# Patient Record
Sex: Female | Born: 1965 | Race: White | Hispanic: No | Marital: Married
Health system: Southern US, Community
[De-identification: ages and names within clinical notes are randomized; demographics above are authoritative.]

---

## 1998-04-10 ENCOUNTER — Other Ambulatory Visit: Admission: RE | Admit: 1998-04-10 | Discharge: 1998-04-10 | Payer: Self-pay | Admitting: Obstetrics and Gynecology

## 2000-11-10 ENCOUNTER — Other Ambulatory Visit: Admission: RE | Admit: 2000-11-10 | Discharge: 2000-11-10 | Payer: Self-pay | Admitting: Obstetrics and Gynecology

## 2006-12-24 ENCOUNTER — Encounter (INDEPENDENT_AMBULATORY_CARE_PROVIDER_SITE_OTHER): Payer: Self-pay | Admitting: Obstetrics and Gynecology

## 2006-12-25 ENCOUNTER — Inpatient Hospital Stay (HOSPITAL_COMMUNITY)
Admission: RE | Admit: 2006-12-25 | Discharge: 2006-12-26 | Payer: Self-pay | Admitting: Thoracic Surgery (Cardiothoracic Vascular Surgery)

## 2007-01-29 ENCOUNTER — Ambulatory Visit (HOSPITAL_COMMUNITY): Admission: RE | Admit: 2007-01-29 | Discharge: 2007-01-29 | Payer: Self-pay | Admitting: Obstetrics and Gynecology

## 2007-04-08 ENCOUNTER — Encounter: Admission: RE | Admit: 2007-04-08 | Discharge: 2007-04-08 | Payer: Self-pay | Admitting: General Surgery

## 2009-03-04 IMAGING — MG MM DIGITAL SCREENING BILAT W/ CAD
4 series · 4 of 4 positions shown · non-contrast
Comparison: none

DG SCREEN MAMMOGRAM BILATERAL
Bilateral CC and MLO view(s) were taken.

DIGITAL SCREENING MAMMOGRAM WITH CAD:
The breast tissue is almost entirely fatty.  There is no dominant mass, architectural distortion or
calcification to suggest malignancy.

[R CC]
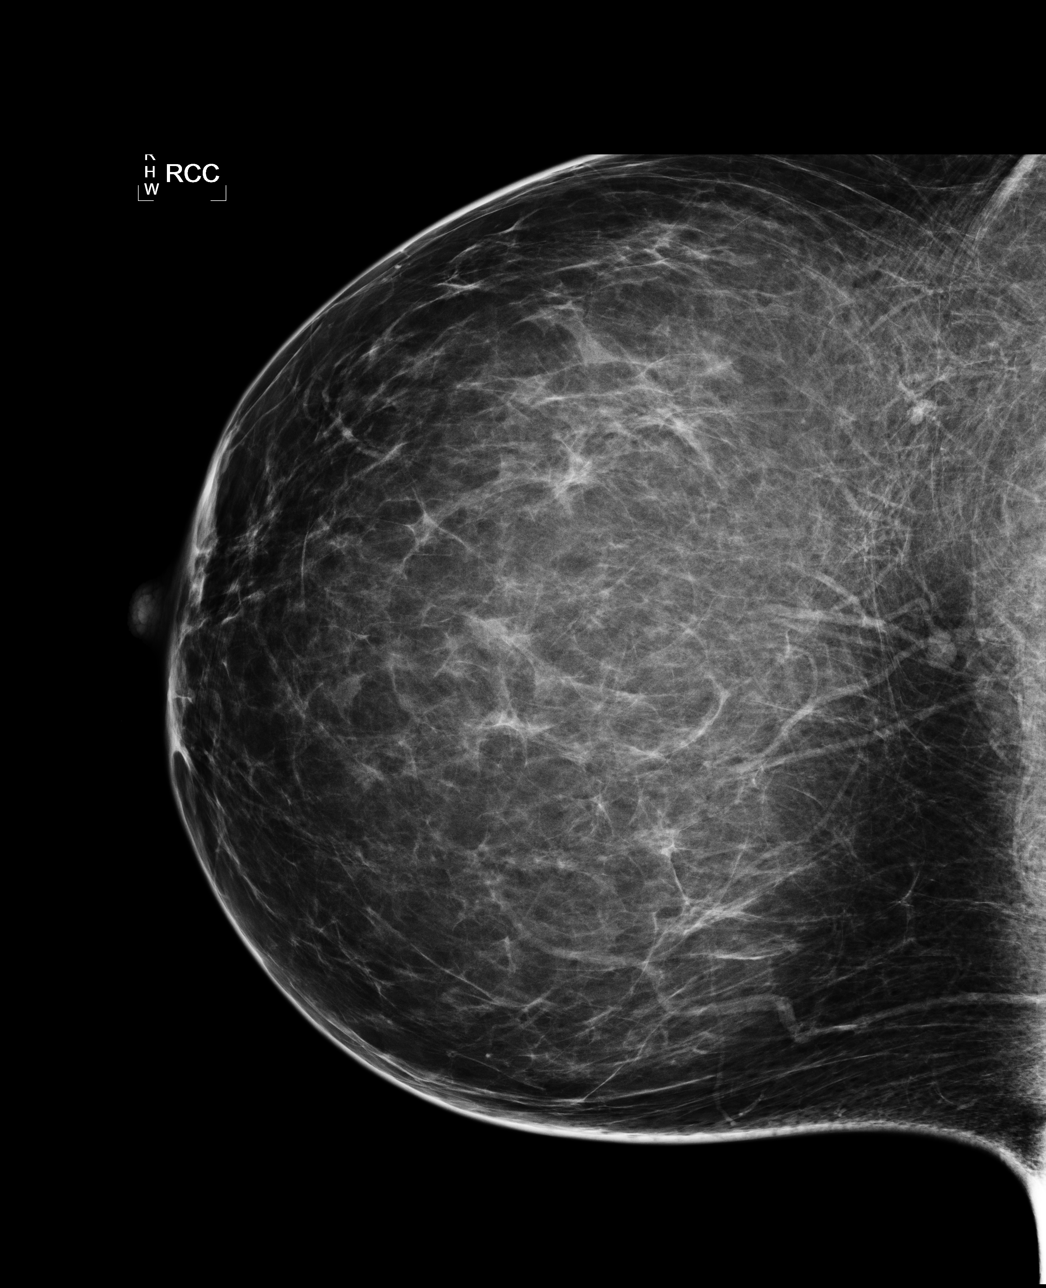

[R MLO]
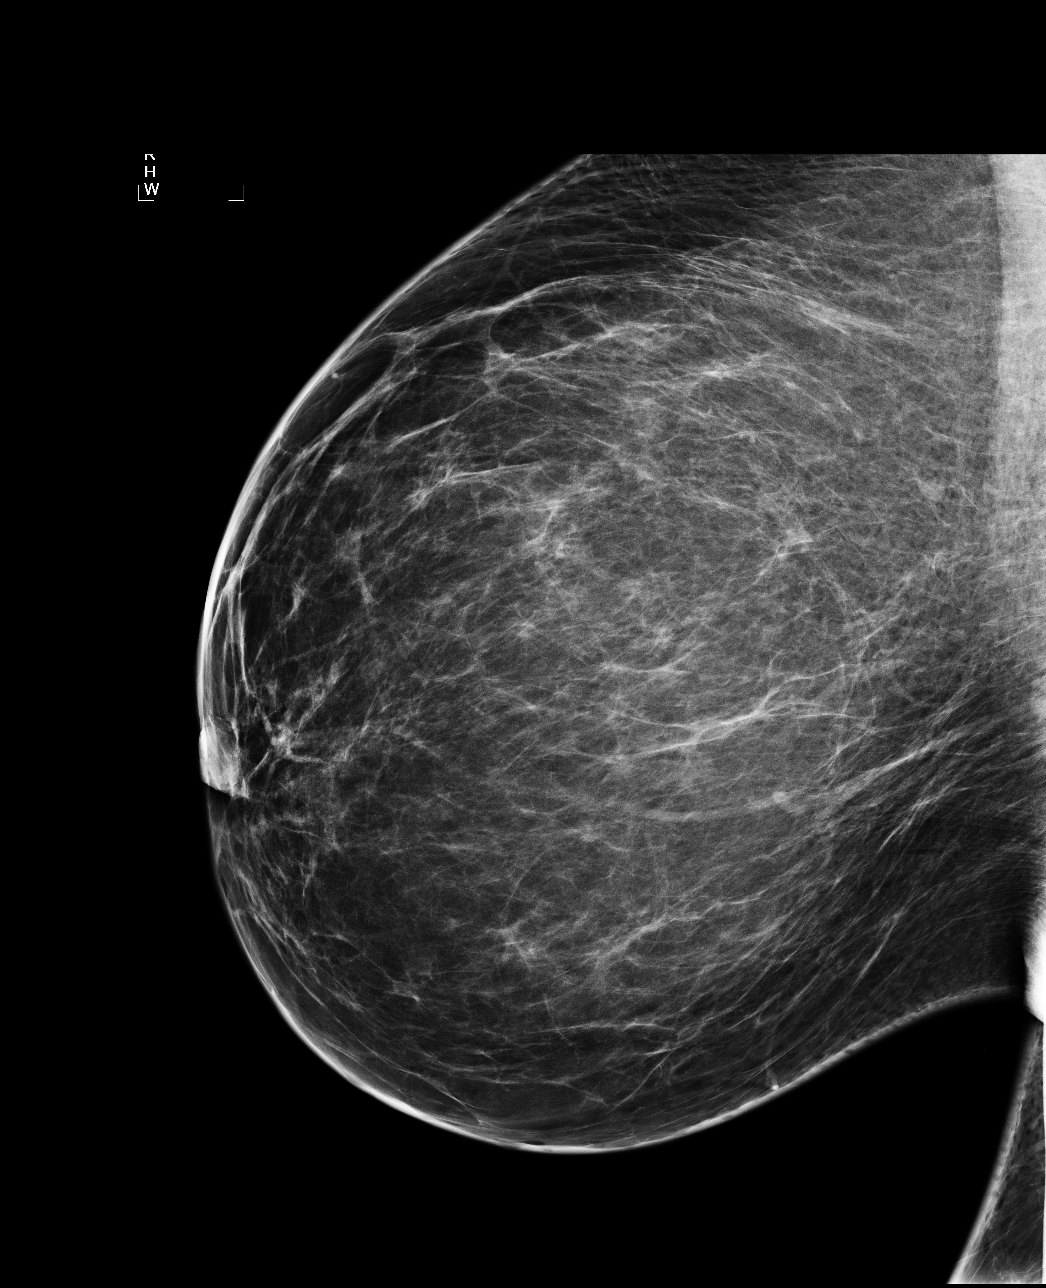

[L CC]
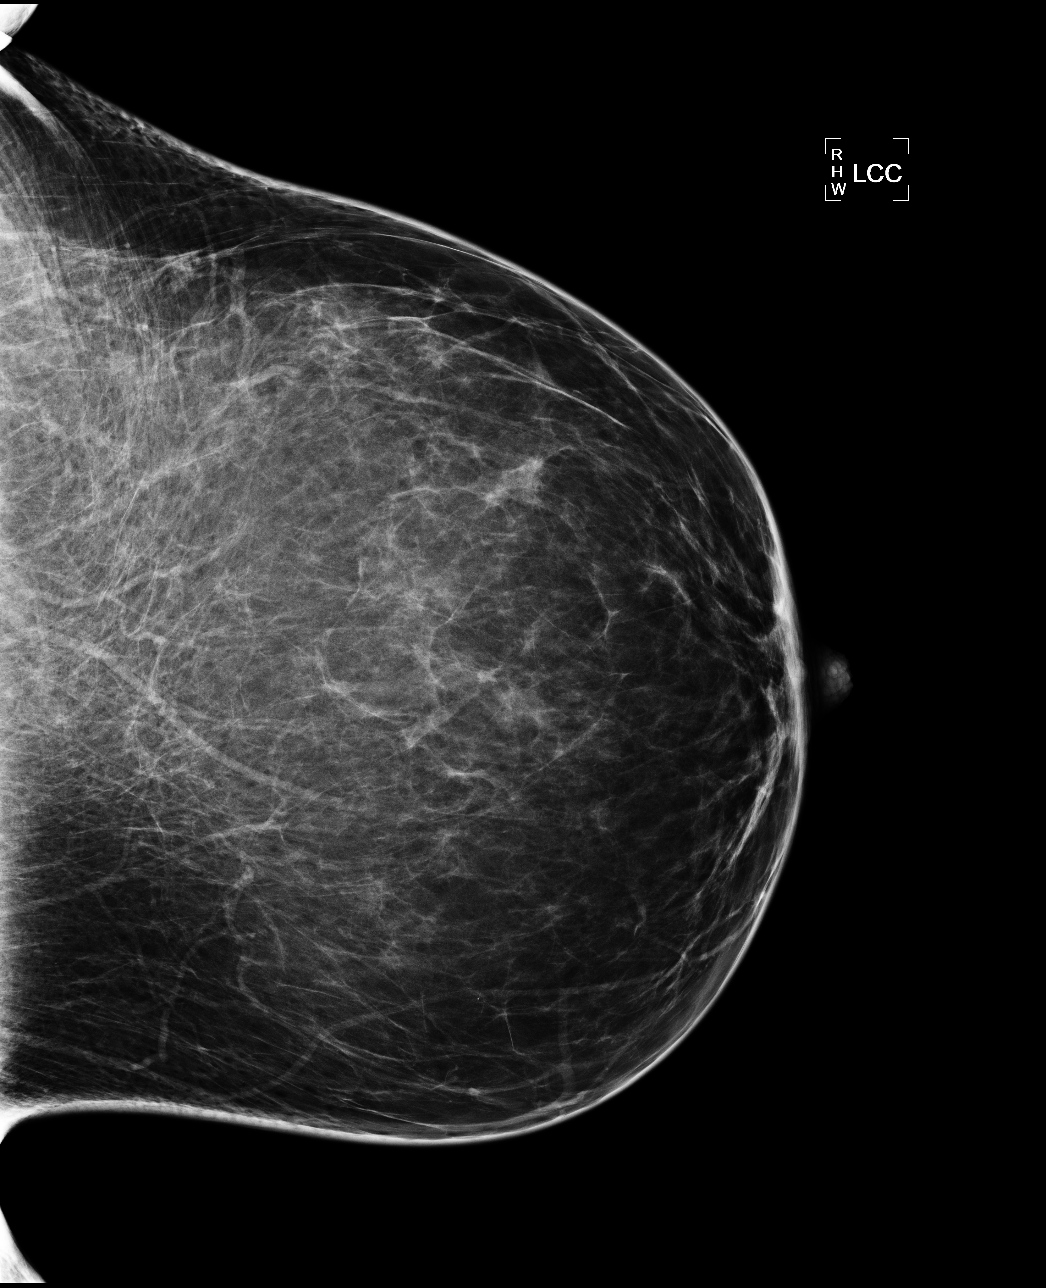

[L MLO]
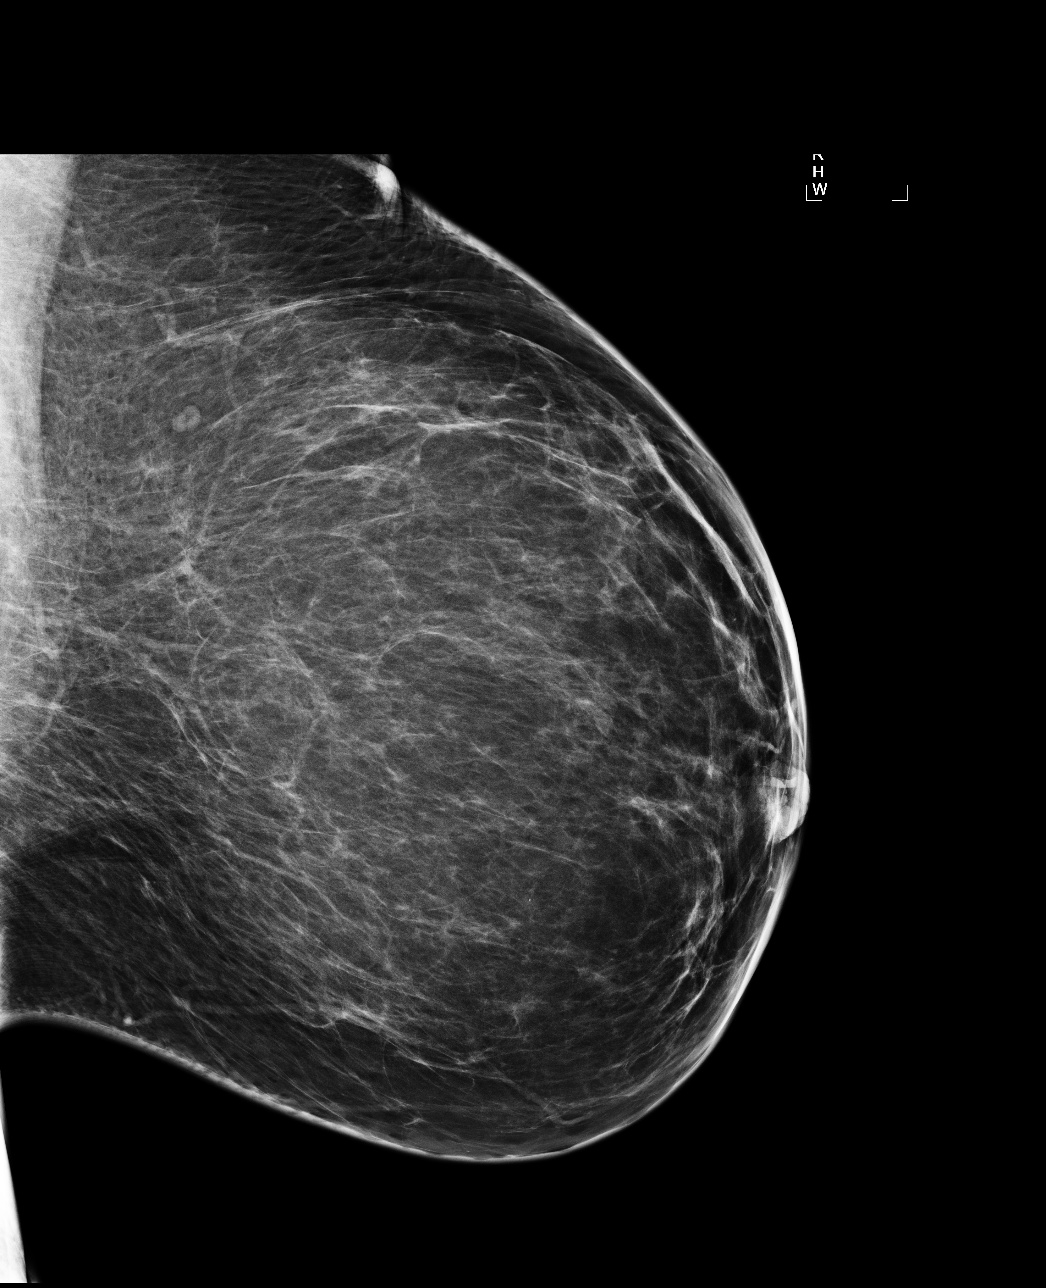

[4 of 4 positions shown; findings below may reference images not displayed]

IMPRESSION: No mammographic evidence of malignancy.  Suggest yearly screening mammography.

ASSESSMENT: Negative - BI-RADS 1

Screening mammogram in 1 year.
ANALYZED BY COMPUTER AIDED DETECTION. , THIS PROCEDURE WAS A DIGITAL MAMMOGRAM.

## 2010-07-03 NOTE — Discharge Summary (Signed)
NAMEMATHA, MASSE                ACCOUNT NO.:  [id number]   MEDICAL RECORD NO.:  [id number]          PATIENT TYPE:  INP   LOCATION:  9310                          FACILITY:  [facility]   PHYSICIAN:  Malachi Pro. Ambrose Mantle, M.D. DATE OF BIRTH:  03/29/1965   DATE OF ADMISSION:  12/24/2006  DATE OF DISCHARGE:  12/26/2006                               DISCHARGE SUMMARY   A 45 year old white female admitted for a surgical removal an abdominal  pelvic mass and abdominal hysterectomy and bilateral salpingo-  oophorectomy, because of menometrorrhagia, history of anemia and 9.1  grams fibroid and probable endometrial polyp.  The patient underwent  laparotomy with peritoneal washings, removal of a left ovarian mass  approximately 10-12 cm in diameter, and then abdominal hysterectomy,  bilateral salpingo-oophorectomy.  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  Luisa Hart stated it is difficult to determine benignity or malignancy on a  frozen section and a malignant tumor.  Blood loss was about 400 mL.  Postoperatively, the patient did well.  On the first post-op day, she  did not feel ready for discharge.  By the second post-op day, she was  tolerating liquids, small amounts of solids and passed flatus.  Her  abdomen was soft and nontender.  She had remained afebrile, and she was  ready for discharge.  Incision appeared to be healing nicely.   Urine pregnancy test was negative.  Comprehensive metabolic profile was  normal.  Initial hemoglobin was 11.9, hematocrit 35.5, white count  7,200, platelet count 289,000, normal differential.  Follow-up  hemoglobin 11.1 and 32.8 on the day of surgery, and on the postoperative  day was 10.1 and 29.2.  Path report is pending.  Peritoneal washings  were negative for malignancy.   FINAL DIAGNOSES:  1. 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   Frozen section benign. 2.      Menometrorrhagia.  History of anemia to 9.1 grams.  2. 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   OPERATION:  Abdominal hysterectomy, bilateral salpingo-oophorectomy.   FINAL CONDITION:  Improved.   INSTRUCTIONS:  Include our regular discharge instructions.  No vaginal  entrance, no heavy lifting or strenuous activity.  Call with any  temperature elevation greater than 100.4 degrees.  Call with any unusual  problems.  The patient is to make an appointment to see me in 3 days, to  have her staples removed.  Percocet 5/325, 36 tablets one every 4 to 6  hours as needed for pain is given at discharge.      Malachi Pro. Ambrose Mantle, M.D.  Electronically Signed     TFH/MEDQ  D:  12/26/2006  T:  12/26/2006  Job:  [id number]

## 2010-07-03 NOTE — Op Note (Signed)
Amy Lewis, Amy Lewis                ACCOUNT NO.:  [id number]   MEDICAL RECORD NO.:  [id number]          PATIENT TYPE:  AMB   LOCATION:  SDC                           FACILITY:  [facility]   PHYSICIAN:  Malachi Pro. Ambrose Mantle, M.D. DATE OF BIRTH:  Jul 17, 1965   DATE OF PROCEDURE:  12/24/2006  DATE OF DISCHARGE:                               OPERATIVE REPORT   PREOPERATIVE DIAGNOSIS:  Abdominal pelvic mass, menometrorrhagia,  history of anemia that was corrected with iron, fibroid uterus, probable  endometrial polyp.   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   OPERATION:  Abdominal hysterectomy, bilateral salpingo-oophorectomy.   OPERATOR:  Malachi Pro. Ambrose Mantle, M.D.   ASSISTANT:  Zenaida Niece, M.D.   ANESTHESIA:  General anesthesia.   The patient was brought to the operating room and placed under  satisfactory general anesthesia. She was placed in the frog-leg  position.  The abdomen, vulva, vagina and urethra were prepped with  Betadine solution and scrub and draped as a sterile field after the  bladder was catheterized with a Foley catheter and hooked to straight  drain.  A transverse incision was made and carried in layers through the  skin, subcutaneous tissue and fascia.  The incision was made and the  skin above the crease so there would be no tendency of the incision to  get buried in the folds of skin.  The fascia was incised transversely  and separated from the rectus muscles superiorly and inferiorly.  The  peritoneum was opened bluntly and then enlarged inferiorly.  Exploration  of the upper abdomen revealed the liver to be smooth.  The gallbladder  was very large and cystic but no stones felt. Both kidneys felt normal.  There were no upper abdominal abnormalities felt.  The large left  ovarian mass was present  just underneath the incision.  I was able to  move this up through the incisional opening without rupturing it after I  had obtained pelvic washings. I removed the large mass by clamping,  cutting and suture ligating along the mesosalpinx and meso-ovarium. The  mass was then sent for frozen section.  Both clamps were doubly suture  ligated. The self-retaining retractor and two packs were used for  exposure.  I placed Kelly clamps across the upper pedicles, divided both  round ligaments, developed a bladder flap, divided the right  infundibulopelvic ligament between two clamps, doubly suture ligated it,  skeletonized the uterine vessels, clamped, cut and suture ligated them,  and then divided the parametrium paracervical tissues and uterosacral  ligaments bilaterally. Vaginal angle sutures were placed after I entered  the vagina and the central portion of the vagina was closed with  interrupted figure-of-eight sutures of 0 Vicryl.  It was difficult to  get complete exposure inferior to the vaginal cuff because of the floppy  bladder and adipose tissue trying to occupy the field and the fact that  the incision was a little bit high on the  abdomen.  I was able to secure  hemostasis. I then sutured the uterosacral ligaments together in the  midline and then tied them together with the sutures that had been  placed in them previously. After I obtained hemostasis, I did not  reperitonealize across the vaginal cuff.  Dr. Luisa Hart had called [place] back  before the hysterectomy was completed and told us that the mass appeared  to be benign grossly, microscopically appeared to be benign, and was  probably a mucinous cystadenoma. I checked for the ureters, the right  ureter was easily visible and was normal in size and contour, the left  ureter could not be visualized but I could palpate it over the internal  iliac vessel. All pedicles were inspected and were dry. The peritoneum  was then closed with a  running suture of 0 Vicryl.  I established  hemostasis under the fascia and reapproximated the fascia with two  running sutures of 0 Vicryl, subcu tissue with a running 3-0 Vicryl, and  the skin was closed with automatic staples.  The patient seemed to  tolerate the procedure well.  Blood loss was estimated by me at about  400 mL.  Sponge and needle counts were correct and she was returned to  recovery in satisfactory condition.      Malachi Pro. Ambrose Mantle, M.D.  Electronically Signed     TFH/MEDQ  D:  12/24/2006  T:  12/24/2006  Job:  [id number]

## 2010-07-03 NOTE — H&P (Signed)
NAMEMAI, LONGNECKER NO.:  [id number]   MEDICAL RECORD NO.:  [id number]          PATIENT TYPE:  INP   LOCATION:                                FACILITY:  [facility]   PHYSICIAN:  Malachi Pro. Ambrose Mantle, M.D.      DATE OF BIRTH:   DATE OF ADMISSION:  12/24/2006  DATE OF DISCHARGE:                              HISTORY & PHYSICAL   This is a 45 year old white female para 2, 0-0-2, who is admitted to the  hospital for abdominal hysterectomy, bilateral salpingo-oophorectomy,  because of menorrhagia, menometrorrhagia, large abdominopelvic mass and  history of anemia to 9.1 gm.  This patient has kept irregular  appointments, but her last period was November 14, 2006, and lasted for  12 days.  She had not been seen in a couple of years until October 31, 2006 when she presented with a hemoglobin of 9.1, stating that her  period had lasted from August 22nd for three weeks, usually lasting 8-9  days, with moderate flow.  The patient was noted to have an abdominal  mass in the left lower quadrant of the abdomen extending two-thirds of  the way to the umbilicus.  An endometrial biopsy revealed benign  interval phase endometrium with small foci of breakdown.  No hyperplasia  or malignancy was identified.  She did undergo a pelvic ultrasound that  showed a small fibroid and endometrial pattern suggestive of a polyp  with an endometrial thickness of 19.25 mm, a right ovarian cyst that was  3 x 2 x 2 cm and a left ovarian cyst that was 11.5 x 10.5 x 7.5 cm.  CA-  125 was 11.8.  Patient was advised of abdominal hysterectomy, removal of  the left tube and ovary, and possibly the right tube and ovary.  She was  also placed on iron and was to take it daily.  She has elected to  proceed with TAH/BSO.   PAST MEDICAL HISTORY:  1. Allergies to PENICILLIN, SULFA, and sensitivity to LATEX.  2. Illnesses:  No major adult illnesses.  3. Operations:  None.   REVIEW OF SYSTEMS:  No visual problems.   No heart problems.  No  headaches.  No bowel problems.   SOCIAL HISTORY:  The patient does not smoke or drink.  Occupation is  Audiological scientist.   FAMILY HISTORY:  Mother 30, healthy.  Father 61, died.  Questionable  aneurysm.  She has a 61 year old healthy sister, 67 year old healthy  sister.  She has a 62 year old healthy brother.  The patient's two  children are 53 and 4, female and female, respectively, and both healthy.   MEDICATIONS:  Patient is taking iron and ibuprofen.   PHYSICAL EXAMINATION:  A well-developed, obese white female in no  distress.  Blood pressure 126/84.  Pulse 80.  Weight 241 pounds.  HEENT:  No cranial abnormalities.  Extraocular movements are intact.  Nose and pharynx are clear.  NECK:  Supple without thyromegaly.  HEART:  Normal size and sounds.  No murmur.  LUNGS:  Clear to auscultation.  BREASTS:  Soft without masses.  ABDOMEN:  Soft.  Not tender.  Liver, spleen, and kidneys are not felt.  There is a mass in the left lower quadrant of the abdomen, extending 2/3  of the way to the umbilicus.  The vulva and vagina are clean.  Cervix is  clean.  A Pap smear done in October was within normal limits.  The  uterus is difficult to feel, thought to be slightly enlarged.  Adnexa:  The right is clear.  The left is probably connected to the mass that is  in the left lower quadrant of the abdomen.  RECTAL:  Negative.   ADMITTING IMPRESSION:  Abdominopelvic mass, probable endometrial polyp,  small fibroid, menometrorrhagia, history of anemia.  The patient is  admitted for abdominal hysterectomy, bilateral salpingo-oophorectomy.  I  have consulted with Dr. Consuello Bossier.  He will be available to take  nodes if the patient has a malignancy in the ovary.  Patient has been  informed of the risks of surgery, including but not limited to heart  attack, stroke, pulmonary embolus, wound disruption, hemorrhage with  need for reoperation and/or transfusion, fistula  formation, nerve  injury, intestinal obstruction.  She understands and agrees to proceed.  I have also informed her that it is possible that her sex drive could be  adversely impacted.  She understands and agrees to proceed.      Malachi Pro. Ambrose Mantle, M.D.  Electronically Signed     TFH/MEDQ  D:  12/23/2006  T:  12/23/2006  Job:  [id number]

## 2010-11-27 LAB — CBC
HCT: 32.8 — ABNORMAL LOW
HCT: 35.5 — ABNORMAL LOW
Hemoglobin: 11.1 — ABNORMAL LOW
MCHC: 33.6
MCHC: 33.9
MCV: 84.1
Platelets: 265
Platelets: 289
RBC: 3.48 — ABNORMAL LOW
RDW: 17 — ABNORMAL HIGH
RDW: 17.6 — ABNORMAL HIGH
WBC: 7.2

## 2010-11-27 LAB — COMPREHENSIVE METABOLIC PANEL
BUN: 7
Creatinine, Ser: 0.61
GFR calc Af Amer: >60
GFR calc non Af Amer: >60
Potassium: 3.7
Total Protein: 7.1

## 2010-11-27 LAB — DIFFERENTIAL
Eosinophils Absolute: 0.2
Eosinophils Relative: 2
Lymphocytes Relative: 21
Lymphs Abs: 1.5
Monocytes Absolute: 0.4
Neutro Abs: 5.1
Neutrophils Relative %: 71

## 2010-11-27 LAB — PREGNANCY, URINE: Preg Test, Ur: NEGATIVE

## 2012-12-16 ENCOUNTER — Ambulatory Visit: Payer: Self-pay | Admitting: General Practice

## 2013-02-25 DIAGNOSIS — M25561 Pain in right knee: Secondary | ICD-10-CM | POA: Insufficient documentation

## 2013-03-26 ENCOUNTER — Other Ambulatory Visit: Payer: Self-pay | Admitting: Orthopedic Surgery

## 2013-03-26 DIAGNOSIS — M25561 Pain in right knee: Secondary | ICD-10-CM

## 2013-03-26 DIAGNOSIS — R531 Weakness: Secondary | ICD-10-CM

## 2014-12-20 ENCOUNTER — Other Ambulatory Visit: Payer: Self-pay | Admitting: Family Medicine

## 2014-12-21 LAB — CMP12+LP+TP+TSH+6AC+CBC/D/PLT
A/G RATIO: 1.5 (ref 1.1–2.5)
ALK PHOS: 78 IU/L (ref 39–117)
ALT: 12 IU/L (ref 0–32)
AST: 14 IU/L (ref 0–40)
Albumin: 4 g/dL (ref 3.5–5.5)
BASOS: 0 %
BILIRUBIN TOTAL: 0.3 mg/dL (ref 0.0–1.2)
BUN / CREAT RATIO: 16 (ref 9–23)
BUN: 11 mg/dL (ref 6–24)
Basophils Absolute: 0 10*3/uL (ref 0.0–0.2)
CHLORIDE: 101 mmol/L (ref 97–106)
CHOL/HDL RATIO: 3.8 ratio (ref 0.0–4.4)
CREATININE: 0.69 mg/dL (ref 0.57–1.00)
Calcium: 9.4 mg/dL (ref 8.7–10.2)
Cholesterol, Total: 222 mg/dL — ABNORMAL HIGH (ref 100–199)
EOS (ABSOLUTE): 0.2 10*3/uL (ref 0.0–0.4)
EOS: 2 %
Estimated CHD Risk: 0.8 times avg. (ref 0.0–1.0)
Free Thyroxine Index: 2.1 (ref 1.2–4.9)
GFR, EST AFRICAN AMERICAN: 117 mL/min/{1.73_m2} (ref >59)
GFR, EST NON AFRICAN AMERICAN: 102 mL/min/{1.73_m2} (ref >59)
GGT: 8 IU/L (ref 0–60)
GLUCOSE: 81 mg/dL (ref 65–99)
Globulin, Total: 2.7 g/dL (ref 1.5–4.5)
HDL: 58 mg/dL (ref >39)
HEMATOCRIT: 38.5 % (ref 34.0–46.6)
Hemoglobin: 12.7 g/dL (ref 11.1–15.9)
IMMATURE GRANS (ABS): 0 10*3/uL (ref 0.0–0.1)
IRON: 60 ug/dL (ref 27–159)
Immature Granulocytes: 0 %
LDH: 146 IU/L (ref 119–226)
LDL CALC: 131 mg/dL — AB (ref 0–99)
LYMPHS: 25 %
Lymphocytes Absolute: 1.8 10*3/uL (ref 0.7–3.1)
MCH: 28 pg (ref 26.6–33.0)
MCHC: 33 g/dL (ref 31.5–35.7)
MCV: 85 fL (ref 79–97)
MONOCYTES: 6 %
Monocytes Absolute: 0.4 10*3/uL (ref 0.1–0.9)
NEUTROS ABS: 4.8 10*3/uL (ref 1.4–7.0)
Neutrophils: 67 %
POTASSIUM: 4.6 mmol/L (ref 3.5–5.2)
Phosphorus: 3.9 mg/dL (ref 2.5–4.5)
Platelets: 299 10*3/uL (ref 150–379)
RBC: 4.54 x10E6/uL (ref 3.77–5.28)
RDW: 13.5 % (ref 12.3–15.4)
SODIUM: 139 mmol/L (ref 136–144)
T3 Uptake Ratio: 25 % (ref 24–39)
T4 TOTAL: 8.3 ug/dL (ref 4.5–12.0)
TSH: 5.1 u[IU]/mL — ABNORMAL HIGH (ref 0.450–4.500)
Total Protein: 6.7 g/dL (ref 6.0–8.5)
Triglycerides: 165 mg/dL — ABNORMAL HIGH (ref 0–149)
URIC ACID: 6.3 mg/dL (ref 2.5–7.1)
VLDL CHOLESTEROL CAL: 33 mg/dL (ref 5–40)
WBC: 7.1 10*3/uL (ref 3.4–10.8)

## 2014-12-21 LAB — HGB A1C W/O EAG: HEMOGLOBIN A1C: 5.8 % — AB (ref 4.8–5.6)

## 2014-12-21 LAB — VITAMIN D 25 HYDROXY (VIT D DEFICIENCY, FRACTURES): Vit D, 25-Hydroxy: 22.7 ng/mL — ABNORMAL LOW (ref 30.0–100.0)

## 2015-01-20 IMAGING — MG MM DIGITAL SCREENING BILAT W/ CAD
1 series · 4 of 4 positions shown · non-contrast
Comparison: Previous exam from 6663 at [HOSPITAL] of

CLINICAL DATA: Screening.

EXAM:
DIGITAL SCREENING BILATERAL MAMMOGRAM WITH CAD

[R CC · right · 4 of 4 slices shown]
[im 1/4]
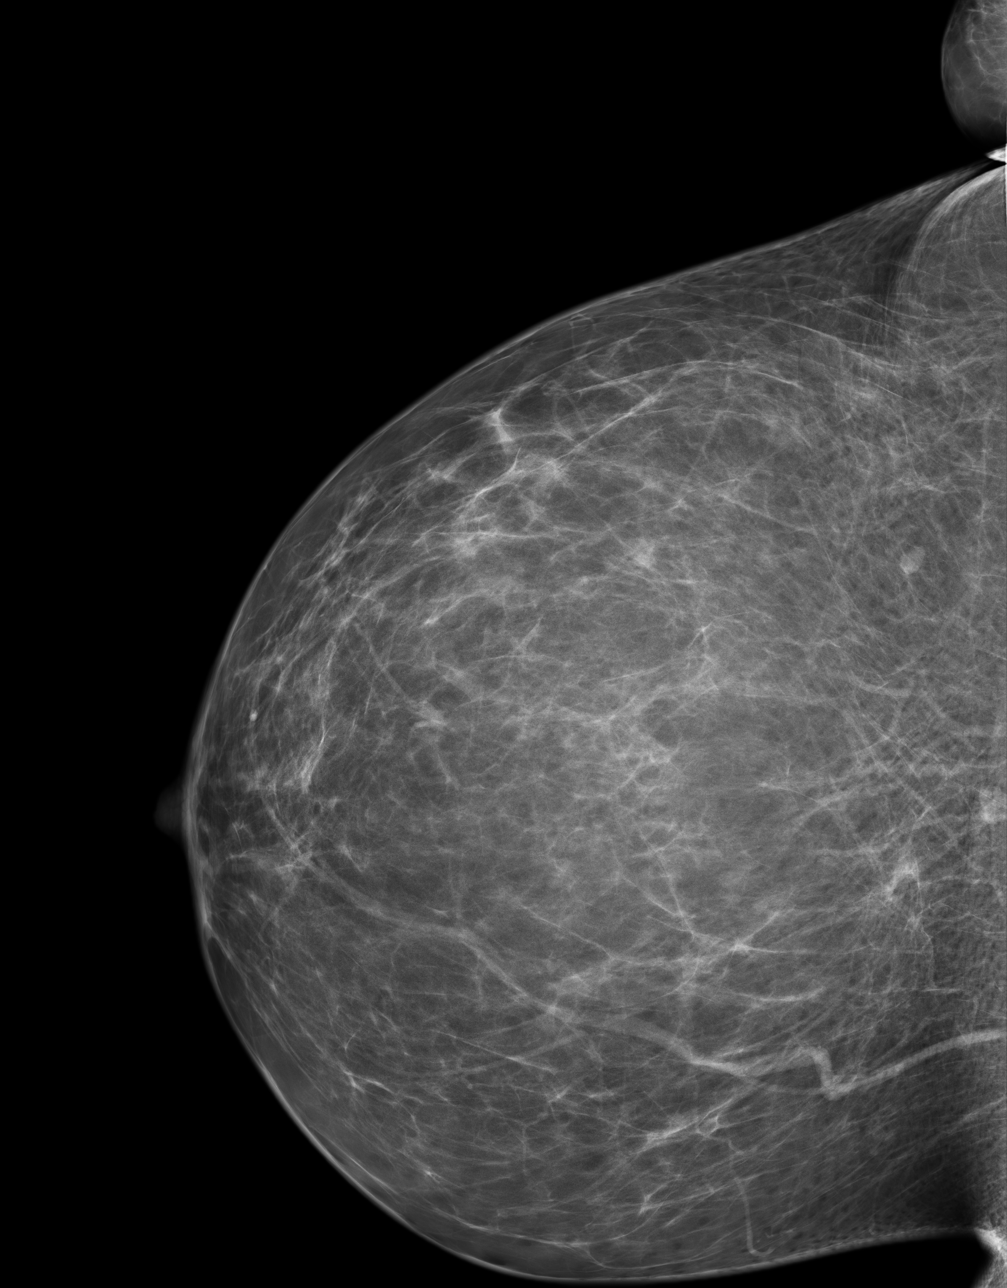
[im 2/4]
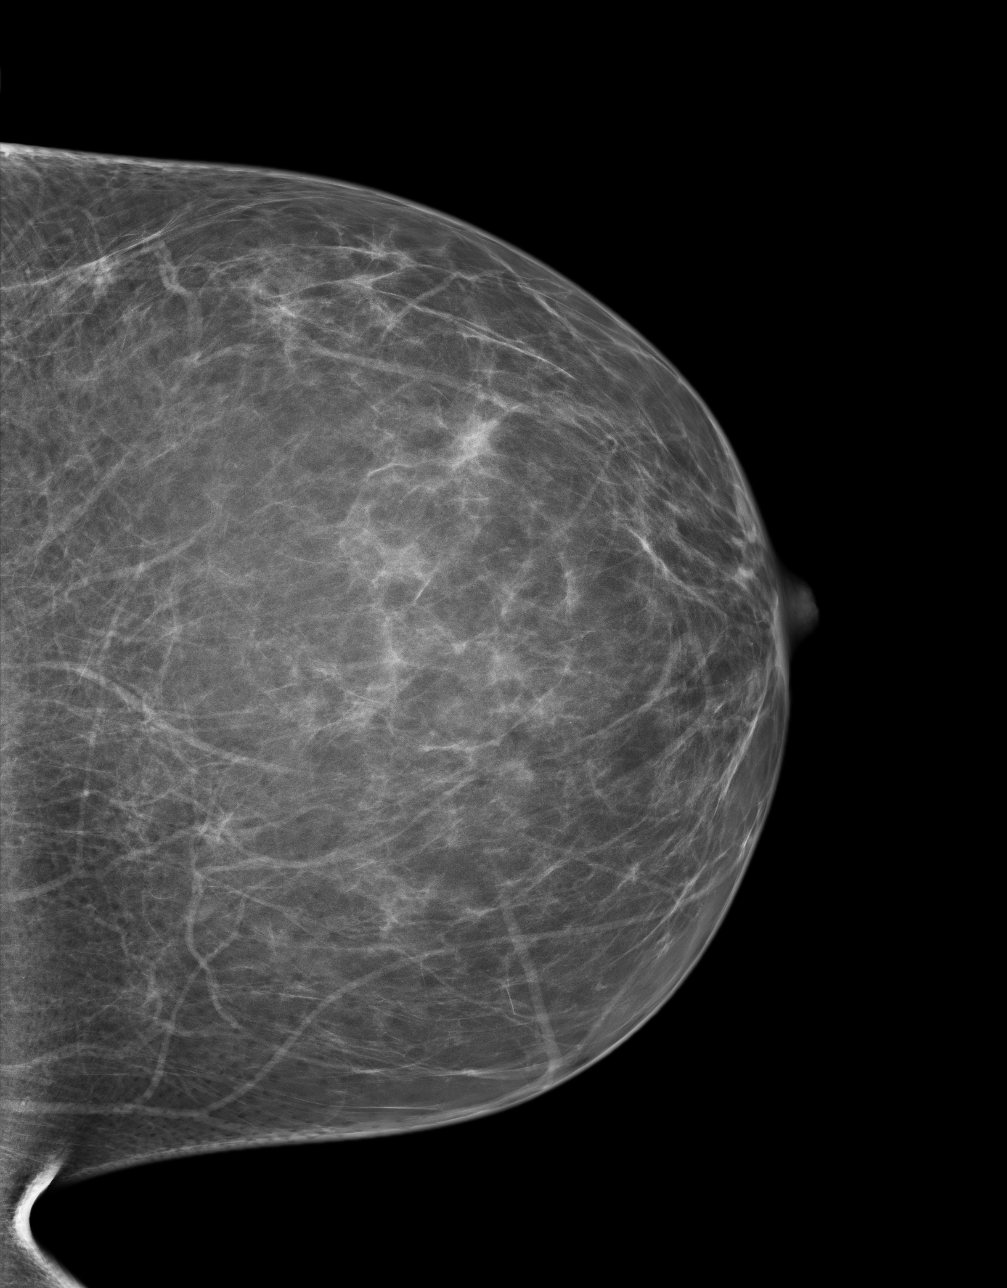
[im 3/4]
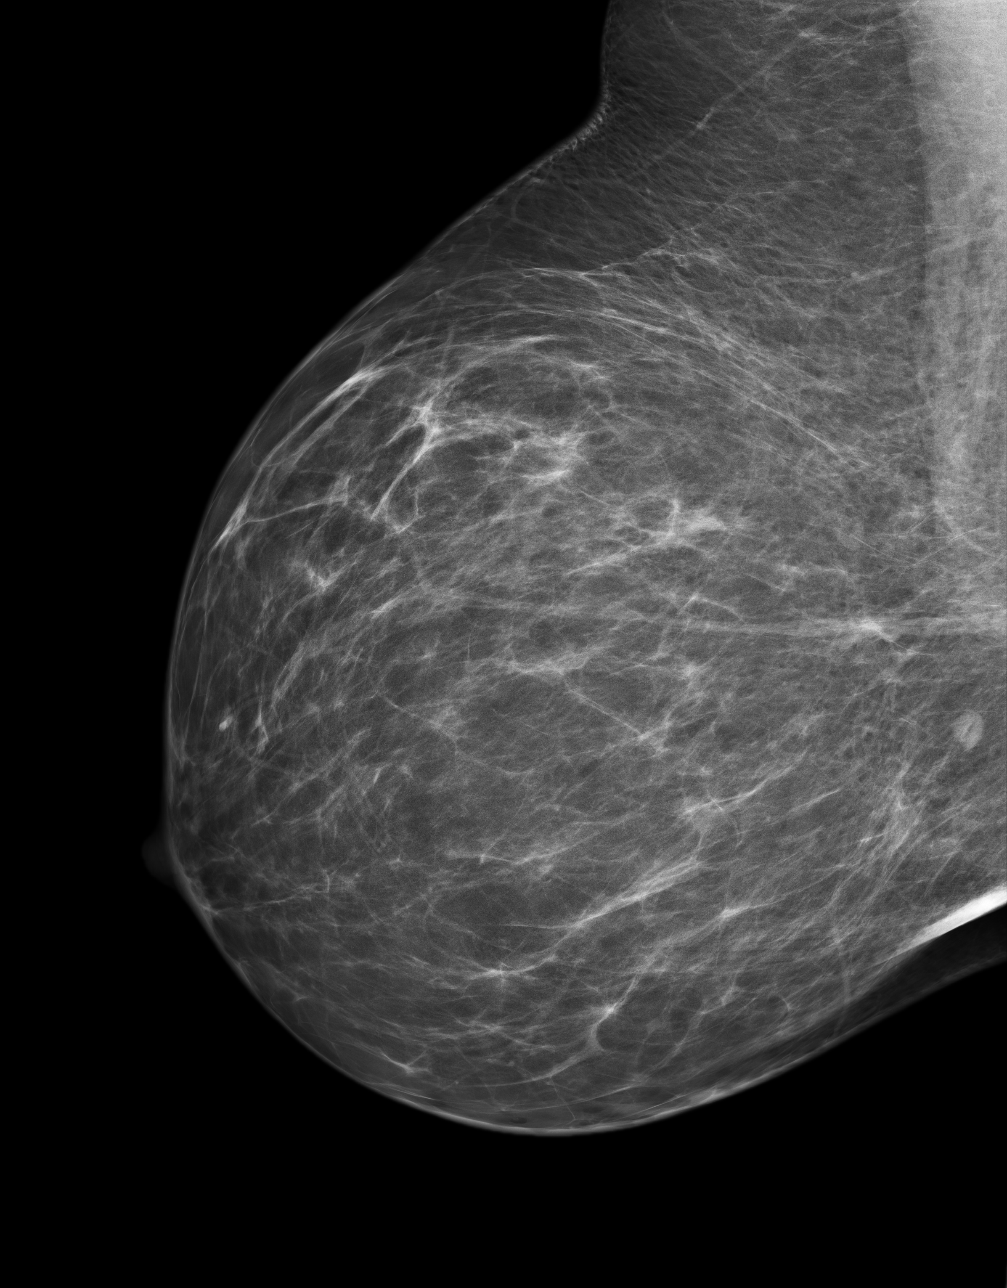
[im 4/4]
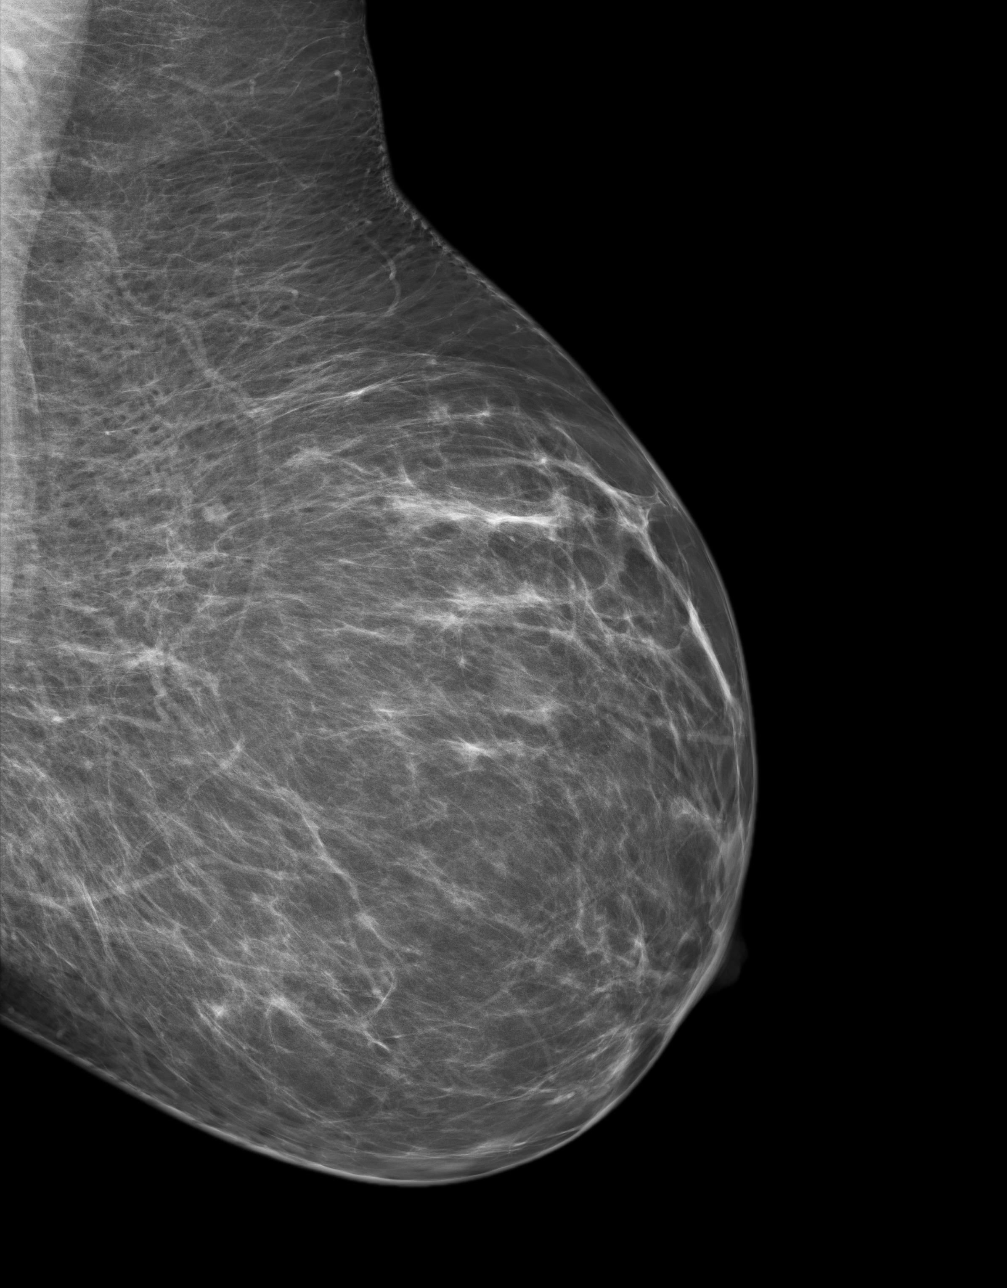

[4 of 4 positions shown; findings below may reference images not displayed]

ACR Breast Density Category b: There are scattered areas of
fibroglandular density.
FINDINGS: There are no findings suspicious for malignancy. Images were
processed with CAD.
IMPRESSION: No mammographic evidence of malignancy. A result letter of this
screening mammogram will be mailed directly to the patient.

RECOMMENDATION:
Screening mammogram in one year. (Code:SN-7-40Y)

BI-RADS CATEGORY  1: Negative

## 2015-11-28 ENCOUNTER — Other Ambulatory Visit: Payer: Self-pay | Admitting: Family Medicine

## 2015-11-29 LAB — CMP12+LP+TP+TSH+6AC+CBC/D/PLT
A/G RATIO: 1.4 (ref 1.2–2.2)
ALBUMIN: 4.3 g/dL (ref 3.5–5.5)
ALK PHOS: 74 IU/L (ref 39–117)
ALT: 12 IU/L (ref 0–32)
AST: 17 IU/L (ref 0–40)
BASOS: 1 %
BUN / CREAT RATIO: 19 (ref 9–23)
BUN: 12 mg/dL (ref 6–24)
Basophils Absolute: 0 10*3/uL (ref 0.0–0.2)
Bilirubin Total: 0.5 mg/dL (ref 0.0–1.2)
CALCIUM: 9.2 mg/dL (ref 8.7–10.2)
CHOL/HDL RATIO: 3.7 ratio (ref 0.0–4.4)
CHOLESTEROL TOTAL: 235 mg/dL — AB (ref 100–199)
CREATININE: 0.62 mg/dL (ref 0.57–1.00)
Chloride: 101 mmol/L (ref 96–106)
EOS (ABSOLUTE): 0.1 10*3/uL (ref 0.0–0.4)
EOS: 2 %
ESTIMATED CHD RISK: 0.7 times avg. (ref 0.0–1.0)
Free Thyroxine Index: 1.8 (ref 1.2–4.9)
GFR calc Af Amer: 121 mL/min/{1.73_m2} (ref >59)
GFR, EST NON AFRICAN AMERICAN: 105 mL/min/{1.73_m2} (ref >59)
GGT: 8 IU/L (ref 0–60)
GLOBULIN, TOTAL: 3.1 g/dL (ref 1.5–4.5)
Glucose: 78 mg/dL (ref 65–99)
HDL: 64 mg/dL (ref >39)
HEMATOCRIT: 39.2 % (ref 34.0–46.6)
HEMOGLOBIN: 13.2 g/dL (ref 11.1–15.9)
Immature Grans (Abs): 0 10*3/uL (ref 0.0–0.1)
Immature Granulocytes: 0 %
Iron: 102 ug/dL (ref 27–159)
LDH: 171 IU/L (ref 119–226)
LDL CALC: 130 mg/dL — AB (ref 0–99)
LYMPHS ABS: 2 10*3/uL (ref 0.7–3.1)
Lymphs: 30 %
MCH: 28.9 pg (ref 26.6–33.0)
MCHC: 33.7 g/dL (ref 31.5–35.7)
MCV: 86 fL (ref 79–97)
Monocytes Absolute: 0.4 10*3/uL (ref 0.1–0.9)
Monocytes: 5 %
NEUTROS ABS: 4.1 10*3/uL (ref 1.4–7.0)
Neutrophils: 62 %
PLATELETS: 286 10*3/uL (ref 150–379)
POTASSIUM: 4.5 mmol/L (ref 3.5–5.2)
Phosphorus: 3.4 mg/dL (ref 2.5–4.5)
RBC: 4.57 x10E6/uL (ref 3.77–5.28)
RDW: 13.4 % (ref 12.3–15.4)
SODIUM: 141 mmol/L (ref 134–144)
T3 UPTAKE RATIO: 24 % (ref 24–39)
T4, Total: 7.4 ug/dL (ref 4.5–12.0)
TRIGLYCERIDES: 207 mg/dL — AB (ref 0–149)
TSH: 3.56 u[IU]/mL (ref 0.450–4.500)
Total Protein: 7.4 g/dL (ref 6.0–8.5)
Uric Acid: 6.3 mg/dL (ref 2.5–7.1)
VLDL Cholesterol Cal: 41 mg/dL — ABNORMAL HIGH (ref 5–40)
WBC: 6.5 10*3/uL (ref 3.4–10.8)

## 2015-11-29 LAB — HGB A1C W/O EAG: HEMOGLOBIN A1C: 5.3 % (ref 4.8–5.6)

## 2016-03-25 ENCOUNTER — Ambulatory Visit: Payer: Self-pay | Admitting: *Deleted

## 2016-03-25 DIAGNOSIS — R109 Unspecified abdominal pain: Secondary | ICD-10-CM

## 2016-03-25 LAB — POCT UA - MICROSCOPIC ONLY
BLOOD UA: NEGATIVE
Bilirubin (Urine): NEGATIVE
Glucose, Ur: NEGATIVE
Ketones, urine, test strip: NEGATIVE
LEUKOCYTES UA: NEGATIVE
NITRITE UA: NEGATIVE
PH URINE, INITIAL: 6
PROTEIN UR: NEGATIVE
SPEC GRAV UA: 1.02
UROBILINOGEN UA: NORMAL

## 2016-03-25 NOTE — Progress Notes (Signed)
Pt c/o R flank pain intermittently x2 days. Hesitancy with urination. Denies frequency, urgency, dysuria or malodorous urine. No Hx kidney stones. Urine dipstick performed in clinic WNL. She reports hydrating well yesterday in case it is UTI or kidney stone. Pain subsided but returned yesterday evening. Recommend hydration again today, ibuprofen for pain.F/u with PCP if not improved in 2-3 days, or sooner if she develops worsening pain, fever, chills, hematuria. Pt requested Biofreeze in case of muscle strain. Provided this. No further questions/concerns.

## 2016-03-25 NOTE — Addendum Note (Signed)
Addended by: Loraine GripWORKMAN, Krysteena Stalker S on: 03/25/2016 02:02 PM   Modules accepted: Orders

## 2016-05-13 ENCOUNTER — Ambulatory Visit: Payer: Self-pay | Admitting: *Deleted

## 2016-05-13 VITALS — BP 170/110

## 2016-05-13 DIAGNOSIS — I10 Essential (primary) hypertension: Secondary | ICD-10-CM

## 2016-05-13 DIAGNOSIS — H9201 Otalgia, right ear: Secondary | ICD-10-CM

## 2016-05-13 NOTE — Progress Notes (Signed)
05/13/16 @ 1100: Pt with R ear pain and eustachian tube discomfort. Hx ear infections. Denies ear drainage. Sounds slightly muffled R ear. R TM air-fluid level, clear, without erythema. L TM with mild scarring at 7 and 9 o'clock positions. No air-fluid level noted, no erythema. BP elevated. Hx of same, but not on any medications. Has not seen PCP "in a while." Will not give decongestant at this time 2/2 HTN. Uses Flonase 2 sprays once daily in am. Gave saline spray to use as needed. Encouraged twice daily showering with saline use in shower, then Flonase to follow, 1 spray twice daily. Appt made for NP clinic.

## 2016-05-13 NOTE — Progress Notes (Signed)
Pt with R ear pain and eustachian tube discomfort. Hx ear infections. Denies ear drainage. Sounds slightly muffled R ear. R TM air-fluid level, clear, without erythema. L TM with mild scarring at 7 and 9 o'clock positions. No air-fluid level noted, no erythema. BP elevated. Hx of same, but not on any medications. Has not seen PCP "in a while." Will not give decongestant at this time 2/2 HTN. Uses Flonase 2 sprays once daily in am. Gave saline spray to use as needed. Encouraged twice daily showering with saline use in shower, then Flonase to follow, 1 spray twice daily. Appt made for NP clinic.

## 2016-05-14 ENCOUNTER — Ambulatory Visit: Payer: Self-pay | Admitting: Registered Nurse

## 2016-05-14 VITALS — BP 178/110 | HR 99 | Temp 98.9°F

## 2016-05-14 DIAGNOSIS — R030 Elevated blood-pressure reading, without diagnosis of hypertension: Secondary | ICD-10-CM

## 2016-05-14 DIAGNOSIS — J0101 Acute recurrent maxillary sinusitis: Secondary | ICD-10-CM

## 2016-05-14 DIAGNOSIS — H6593 Unspecified nonsuppurative otitis media, bilateral: Secondary | ICD-10-CM

## 2016-05-14 DIAGNOSIS — J301 Allergic rhinitis due to pollen: Secondary | ICD-10-CM

## 2016-05-14 MED ORDER — SALINE SPRAY 0.65 % NA SOLN: 2.0000 | NASAL | 0 refills | Status: DC

## 2016-05-14 MED ORDER — AZITHROMYCIN 250 MG PO TABS: ORAL_TABLET | ORAL | 0 refills | Status: DC

## 2016-05-14 MED ORDER — ACETAMINOPHEN 500 MG PO TABS: 500.0000 mg | ORAL_TABLET | Freq: Four times a day (QID) | ORAL | 0 refills | Status: AC | PRN

## 2016-05-14 MED ORDER — CETIRIZINE HCL 10 MG PO TABS: 10.0000 mg | ORAL_TABLET | Freq: Every day | ORAL | 0 refills | Status: DC

## 2016-05-14 NOTE — Progress Notes (Signed)
Subjective:    Patient ID: Amy Lewis, female    DOB: Mar 24, 1965, 51 y.o.   MRN: [id number]  51y/o caucasian female here for evaluation ear pain.  Initially seen by occupational medicine clinic RN on  05/13/16 @ 1100: Pt with R ear pain and eustachian tube discomfort. Hx ear infections. Denies ear drainage. Sounds slightly muffled R ear. Nurse reported R TM air-fluid level, clear, without erythema. L TM with mild scarring at 7 and 9 o'clock positions. No air-fluid level noted, no erythema. BP elevated. Hx of same, but not on any medications. Has not seen PCP "in a while." Will not give decongestant at this time due to elevated blood pressure. Uses Flonase 2 sprays once daily in am. Gave saline spray to use as needed. Encouraged twice daily showering with saline use in shower, then Flonase to follow, 1 spray twice daily. Appt made for NP clinic on Tuesday 05/14/2016.  Patient reported this am slight improvement nasal symptoms but still having right ear pain and lateral neck discomfort has used 3 doses flonase and nasal saline this week.  Last sinus infection fall 2017 was seen urgent care per patient.  Azithromycin usually works well for her allergic to penicillins and sulfa drugs.  Intermittent teeth discomfort also.  Had stressful meeting at work this morning.  Denied caffeine intake.  Typically walks/exercises to relieve stress and hasn't had a chance to do so yet today.  Her blood pressure not typically this high per patient or upon chart reivew.      Review of Systems  Constitutional: Negative for activity change, appetite change, chills, diaphoresis, fatigue, fever and unexpected weight change.  HENT: Positive for congestion, ear pain, hearing loss, postnasal drip, rhinorrhea, sinus pain and sinus pressure. Negative for dental problem, drooling, ear discharge, facial swelling, mouth sores, nosebleeds, sneezing, sore throat, tinnitus, trouble swallowing and voice change.   Eyes: Negative for  photophobia, pain, discharge, redness, itching and visual disturbance.  Respiratory: Negative for cough, choking, chest tightness, shortness of breath, wheezing and stridor.   Cardiovascular: Negative for chest pain, palpitations and leg swelling.  Gastrointestinal: Negative for abdominal distention, abdominal pain, blood in stool, constipation, diarrhea, nausea and vomiting.  Endocrine: Negative for cold intolerance and heat intolerance.  Genitourinary: Negative for difficulty urinating, dysuria and hematuria.  Musculoskeletal: Negative for arthralgias, back pain, gait problem, joint swelling, myalgias, neck pain and neck stiffness.  Skin: Negative for color change, pallor, rash and wound.  Allergic/Immunologic: Positive for environmental allergies. Negative for food allergies.  Neurological: Positive for headaches. Negative for dizziness, tremors, seizures, syncope, facial asymmetry, speech difficulty, weakness, light-headedness and numbness.  Hematological: Negative for adenopathy. Does not bruise/bleed easily.  Psychiatric/Behavioral: Negative for agitation, behavioral problems, confusion and sleep disturbance.       Objective:   Physical Exam  Constitutional: She is oriented to person, place, and time. She appears well-developed and well-nourished. She is active and cooperative.  Non-toxic appearance. She does not have a sickly appearance. She does not appear ill. No distress.  HENT:  Head: Normocephalic and atraumatic.  Right Ear: Hearing, external ear and ear canal normal. A middle ear effusion is present.  Left Ear: Hearing, external ear and ear canal normal. A middle ear effusion is present.  Nose: Mucosal edema and rhinorrhea present. No nose lacerations, sinus tenderness, nasal deformity, septal deviation or nasal septal hematoma. No epistaxis.  No foreign bodies. Right sinus exhibits maxillary sinus tenderness and frontal sinus tenderness. Left sinus exhibits maxillary sinus  tenderness and frontal sinus tenderness.  Mouth/Throat: Uvula is midline and mucous membranes are normal. Mucous membranes are not pale, not dry and not cyanotic. She does not have dentures. No oral lesions. No trismus in the jaw. Normal dentition. No dental abscesses, uvula swelling, lacerations or dental caries. Posterior oropharyngeal edema and posterior oropharyngeal erythema present. No oropharyngeal exudate or tonsillar abscesses.  Bilateral allergic shiners; bilateral TMs air fluid level slight erythema at edges but not centrally fluid level clear; bilateral nasal turbinates edema/erythema clear discharge; maxillary greater than frontal sinuses TTP left greater than right; cobblestoning posteiror pharynx white opaque drip from above  Eyes: Conjunctivae, EOM and lids are normal. Pupils are equal, round, and reactive to light. Right eye exhibits no chemosis, no discharge, no exudate and no hordeolum. No foreign body present in the right eye. Left eye exhibits no chemosis, no discharge, no exudate and no hordeolum. No foreign body present in the left eye. Right conjunctiva is not injected. Right conjunctiva has no hemorrhage. Left conjunctiva is not injected. Left conjunctiva has no hemorrhage. No scleral icterus. Right eye exhibits normal extraocular motion and no nystagmus. Left eye exhibits normal extraocular motion and no nystagmus. Right pupil is round and reactive. Left pupil is round and reactive. Pupils are equal.  Neck: Trachea normal and normal range of motion. Neck supple. No tracheal tenderness, no spinous process tenderness and no muscular tenderness present. No neck rigidity. No tracheal deviation, no edema, no erythema and normal range of motion present. No thyroid mass and no thyromegaly present.  Cardiovascular: Normal rate, regular rhythm, S1 normal, S2 normal, normal heart sounds and intact distal pulses.  PMI is not displaced.  Exam reveals no gallop and no friction rub.   No murmur  heard. Pulses:      Radial pulses are 2+ on the right side, and 2+ on the left side.  Pulmonary/Chest: Effort normal and breath sounds normal. No accessory muscle usage or stridor. No respiratory distress. She has no decreased breath sounds. She has no wheezes. She has no rhonchi. She has no rales. She exhibits no tenderness.  Abdominal: Soft. She exhibits no distension.  Musculoskeletal: Normal range of motion. She exhibits no edema or tenderness.       Right shoulder: Normal.       Left shoulder: Normal.       Right hip: Normal.       Left hip: Normal.       Right knee: Normal.       Left knee: Normal.       Cervical back: Normal.       Right hand: Normal.       Left hand: Normal.  Lymphadenopathy:       Head (right side): No submental, no submandibular, no tonsillar, no preauricular, no posterior auricular and no occipital adenopathy present.       Head (left side): No submental, no submandibular, no tonsillar, no preauricular, no posterior auricular and no occipital adenopathy present.    She has no cervical adenopathy.       Right cervical: No superficial cervical, no deep cervical and no posterior cervical adenopathy present.      Left cervical: No superficial cervical, no deep cervical and no posterior cervical adenopathy present.  Neurological: She is alert and oriented to person, place, and time. She has normal strength. She is not disoriented. She displays no atrophy and no tremor. No cranial nerve deficit or sensory deficit. She exhibits normal muscle tone. She displays  no seizure activity. Coordination and gait normal. GCS eye subscore is 4. GCS verbal subscore is 5. GCS motor subscore is 6.  Skin: Skin is warm, dry and intact. No abrasion, no bruising, no burn, no ecchymosis, no laceration, no lesion, no petechiae and no rash noted. She is not diaphoretic. No cyanosis or erythema. No pallor. Nails show no clubbing.  Psychiatric: She has a normal mood and affect. Her speech is  normal and behavior is normal. Judgment and thought content normal. Cognition and memory are normal.  Nursing note and vitals reviewed.         Assessment & Plan:  A-recurrent acute maxillary sinusitis; seasonal allergic rhinitis, bilateral otitis media effusion  P-azithromycin 500mg  po today then 250mg  po daily days 2-5 #6 RF0.  Continue nasal saline and flonase spray along with shower twice a day.  flonase 1 spray each nostril BID, saline 2 sprays each nostril q2h prn congestion.  If no improvement with 48 hours of saline and flonase use start azitromycin.  Rx given.  No evidence of systemic bacterial infection, non toxic and well hydrated.  I do not see where any further testing or imaging is necessary at this time.   I will suggest supportive care, rest, good hygiene and encourage the patient to take adequate fluids.  The patient is to return to clinic or EMERGENCY ROOM if symptoms worsen or change significantly.  Exitcare handout on sinusitis given to patient.  Patient verbalized agreement and understanding of treatment plan and had no further questions at this time.   P2:  Hand washing and cover cough   Patient may use normal saline nasal spray as needed.  Consider antihistamine or nasal steroid use.  Avoid triggers if possible.  Shower prior to bedtime if exposed to triggers.  If allergic dust/dust mites recommend mattress/pillow covers/encasements; washing linens, vacuuming, sweeping, dusting weekly.  Call or return to clinic as needed if these symptoms worsen or fail to improve as anticipated.   Exitcare handout on allergic rhinitis given to patient.  Patient verbalized understanding of instructions, agreed with plan of care and had no further questions at this time.  P2:  Avoidance and hand washing.  Discussed eustachian tube dysfunction with patient probably due to seasonal allergies throat irritation.  Supportive treatment.   No evidence of invasive bacterial infection, non toxic and  well hydrated.  This is most likely self limiting viral infection.  I do not see where any further testing or imaging is necessary at this time.   I will suggest supportive care, rest, good hygiene and encourage the patient to take adequate fluids.  The patient is to return to clinic or EMERGENCY ROOM if symptoms worsen or change significantly e.g. ear pain, fever, purulent discharge from ears or bleeding.  Exitcare handout on otitis media with effusion given to patient.  Patient verbalized agreement and understanding of treatment plan.    Discussed signs and symptoms of heart attack and stroke with patient.  Avoid caffeine/intense exercise.  Hydrate, relax.  Use stress relief acitivites/breaks at work.  Follow up with [facility] for blood pressure checks.  Discussed illness, stress, pain, some medications can increase blood pressure.  Tylenol 1000mg  po QID prn pain.  Avoid motrin/advil/aleve/ibuprofen/naproxen as these can worsen blood pressure.  Exitcare handout on stroke prevention given to patient.  Continue to monitor blood pressure at home and maintain log of blood pressure and pulse to bring to follow up appointments.  Continue low sodium diet and  exercise program.  Recommended weight loss/weight maintenance to BMI 20-25.  Return to the clinic if any new symptoms.  Patient verbalized agreement and understanding of treatment plan and had no further questions at this time.   P2:  Diet and Exercise specific for HTN

## 2016-05-14 NOTE — Patient Instructions (Addendum)
Follow up with RN Nance Pew for blood pressure checks Avoid caffeine/intense exercise Hydrate Stress relief measures--breaks, easy walking, breathing exercises If dyspnea, chest pain, jaw pain, sweating, palpitations, shortness of breath, mouth drooping/drooling, difficulty speaking or swallowing, worst headache of life go to ER for re-evaluation as soon as possible.  Shower twice a day Nasal saline aggressive in shower and as needed use for congestion while awake 2 sprays each nostril every 2 hours  flonase 1 spray each nostril twice a day Restart zyrtec 10mg  by mouth daily Start azithromycin if no improvement or worsening in sinus pressure/upper teeth pain/fever greater than 100.5  Stroke Prevention Some health problems and behaviors may make it more likely for you to have a stroke. Below are ways to lessen your risk of having a stroke.  Be active for at least 30 minutes on most or all days.  Do not smoke. Try not to be around others who smoke.  Do not drink too much alcohol.  Do not have more than 2 drinks a day if you are a man.  Do not have more than 1 drink a day if you are a woman and are not pregnant.  Eat healthy foods, such as fruits and vegetables. If you were put on a specific diet, follow the diet as told.  Keep your cholesterol levels under control through diet and medicines. Look for foods that are low in saturated fat, trans fat, cholesterol, and are high in fiber.  If you have diabetes, follow all diet plans and take your medicine as told.  Ask your doctor if you need treatment to lower your blood pressure. If you have high blood pressure (hypertension), follow all diet plans and take your medicine as told by your doctor.  If you are 41-60 years old, have your blood pressure checked every 3-5 years. If you are age 29 or older, have your blood pressure checked every year.  Keep a healthy weight. Eat foods that are low in calories, salt, saturated fat, trans fat,  and cholesterol.  Do not take drugs.  Avoid birth control pills, if this applies. Talk to your doctor about the risks of taking birth control pills.  Talk to your doctor if you have sleep problems (sleep apnea).  Take all medicine as told by your doctor.  You may be told to take aspirin or blood thinner medicine. Take this medicine as told by your doctor.  Understand your medicine instructions.  Make sure any other conditions you have are being taken care of. Get help right away if:  You suddenly lose feeling (you feel numb) or have weakness in your face, arm, or leg.  Your face or eyelid hangs down to one side.  You suddenly feel confused.  You have trouble talking (aphasia) or understanding what people are saying.  You suddenly have trouble seeing in one or both eyes.  You suddenly have trouble walking.  You are dizzy.  You lose your balance or your movements are clumsy (uncoordinated).  You suddenly have a very bad headache and you do not know the cause.  You have new chest pain.  Your heart feels like it is fluttering or skipping a beat (irregular heartbeat). Do not wait to see if the symptoms above go away. Get help right away. Call your local emergency services (911 in [place] Do not drive yourself to the hospital. This information is not intended to replace advice given to you by your health care provider. Make sure you discuss any  questions you have with your health care provider. Document Released: 08/06/2011 Document Revised: 07/13/2015 Document Reviewed: 08/07/2012 Elsevier Interactive Patient Education  2017 [employer]  Hypertension Hypertension, commonly called high blood pressure, is when the force of blood pumping through the arteries is too strong. The arteries are the blood vessels that carry blood from the heart throughout the body. Hypertension forces the heart to work harder to pump blood and may cause arteries to become narrow or stiff. Having  untreated or uncontrolled hypertension can cause heart attacks, strokes, kidney disease, and other problems. A blood pressure reading consists of a higher number over a lower number. Ideally, your blood pressure should be below 120/80. The first ("top") number is called the systolic pressure. It is a measure of the pressure in your arteries as your heart beats. The second ("bottom") number is called the diastolic pressure. It is a measure of the pressure in your arteries as the heart relaxes. What are the causes? The cause of this condition is not known. What increases the risk? Some risk factors for high blood pressure are under your control. Others are not. Factors you can change   Smoking.  Having type 2 diabetes mellitus, high cholesterol, or both.  Not getting enough exercise or physical activity.  Being overweight.  Having too much fat, sugar, calories, or salt (sodium) in your diet.  Drinking too much alcohol. Factors that are difficult or impossible to change   Having chronic kidney disease.  Having a family history of high blood pressure.  Age. Risk increases with age.  Race. You may be at higher risk if you are African-American.  Gender. Men are at higher risk than women before age 31. After age 28, women are at higher risk than men.  Having obstructive sleep apnea.  Stress. What are the signs or symptoms? Extremely high blood pressure (hypertensive crisis) may cause:  Headache.  Anxiety.  Shortness of breath.  Nosebleed.  Nausea and vomiting.  Severe chest pain.  Jerky movements you cannot control (seizures). How is this diagnosed? This condition is diagnosed by measuring your blood pressure while you are seated, with your arm resting on a surface. The cuff of the blood pressure monitor will be placed directly against the skin of your upper arm at the level of your heart. It should be measured at least twice using the same arm. Certain conditions can  cause a difference in blood pressure between your right and left arms. Certain factors can cause blood pressure readings to be lower or higher than normal (elevated) for a short period of time:  When your blood pressure is higher when you are in a health care provider's office than when you are at home, this is called white coat hypertension. Most people with this condition do not need medicines.  When your blood pressure is higher at home than when you are in a health care provider's office, this is called masked hypertension. Most people with this condition may need medicines to control blood pressure. If you have a high blood pressure reading during one visit or you have normal blood pressure with other risk factors:  You may be asked to return on a different day to have your blood pressure checked again.  You may be asked to monitor your blood pressure at home for 1 week or longer. If you are diagnosed with hypertension, you may have other blood or imaging tests to help your health care provider understand your overall risk for other conditions.  How is this treated? This condition is treated by making healthy lifestyle changes, such as eating healthy foods, exercising more, and reducing your alcohol intake. Your health care provider may prescribe medicine if lifestyle changes are not enough to get your blood pressure under control, and if:  Your systolic blood pressure is above 130.  Your diastolic blood pressure is above 80. Your personal target blood pressure may vary depending on your medical conditions, your age, and other factors. Follow these instructions at home: Eating and drinking   Eat a diet that is high in fiber and potassium, and low in sodium, added sugar, and fat. An example eating plan is called the DASH (Dietary Approaches to Stop Hypertension) diet. To eat this way:  Eat plenty of fresh fruits and vegetables. Try to fill half of your plate at each meal with fruits and  vegetables.  Eat whole grains, such as whole wheat pasta, brown rice, or whole grain bread. Fill about one quarter of your plate with whole grains.  Eat or drink low-fat dairy products, such as skim milk or low-fat yogurt.  Avoid fatty cuts of meat, processed or cured meats, and poultry with skin. Fill about one quarter of your plate with lean proteins, such as fish, chicken without skin, beans, eggs, and tofu.  Avoid premade and processed foods. These tend to be higher in sodium, added sugar, and fat.  Reduce your daily sodium intake. Most people with hypertension should eat less than 1,500 mg of sodium a day.  Limit alcohol intake to no more than 1 drink a day for nonpregnant women and 2 drinks a day for men. One drink equals 12 oz of beer, 5 oz of wine, or 1 oz of hard liquor. Lifestyle   Work with your health care provider to maintain a healthy body weight or to lose weight. Ask what an ideal weight is for you.  Get at least 30 minutes of exercise that causes your heart to beat faster (aerobic exercise) most days of the week. Activities may include walking, swimming, or biking.  Include exercise to strengthen your muscles (resistance exercise), such as pilates or lifting weights, as part of your weekly exercise routine. Try to do these types of exercises for 30 minutes at least 3 days a week.  Do not use any products that contain nicotine or tobacco, such as cigarettes and e-cigarettes. If you need help quitting, ask your health care provider.  Monitor your blood pressure at home as told by your health care provider.  Keep all follow-up visits as told by your health care provider. This is important. Medicines   Take over-the-counter and prescription medicines only as told by your health care provider. Follow directions carefully. Blood pressure medicines must be taken as prescribed.  Do not skip doses of blood pressure medicine. Doing this puts you at risk for problems and can make  the medicine less effective.  Ask your health care provider about side effects or reactions to medicines that you should watch for. Contact a health care provider if:  You think you are having a reaction to a medicine you are taking.  You have headaches that keep coming back (recurring).  You feel dizzy.  You have swelling in your ankles.  You have trouble with your vision. Get help right away if:  You develop a severe headache or confusion.  You have unusual weakness or numbness.  You feel faint.  You have severe pain in your chest or abdomen.  You  vomit repeatedly.  You have trouble breathing. Summary  Hypertension is when the force of blood pumping through your arteries is too strong. If this condition is not controlled, it may put you at risk for serious complications.  Your personal target blood pressure may vary depending on your medical conditions, your age, and other factors. For most people, a normal blood pressure is less than 120/80.  Hypertension is treated with lifestyle changes, medicines, or a combination of both. Lifestyle changes include weight loss, eating a healthy, low-sodium diet, exercising more, and limiting alcohol. This information is not intended to replace advice given to you by your health care provider. Make sure you discuss any questions you have with your health care provider. Document Released: 02/04/2005 Document Revised: 01/03/2016 Document Reviewed: 01/03/2016 Elsevier Interactive Patient Education  2017 [employer]  Sinusitis, Adult Sinusitis is soreness and inflammation of your sinuses. Sinuses are hollow spaces in the bones around your face. Your sinuses are located:  Around your eyes.  In the middle of your forehead.  Behind your nose.  In your cheekbones. Your sinuses and nasal passages are lined with a stringy fluid (mucus). Mucus normally drains out of your sinuses. When your nasal tissues become inflamed or swollen, the  mucus can become trapped or blocked so air cannot flow through your sinuses. This allows bacteria, viruses, and funguses to grow, which leads to infection. Sinusitis can develop quickly and last for 7?10 days (acute) or for more than 12 weeks (chronic). Sinusitis often develops after a cold. What are the causes? This condition is caused by anything that creates swelling in the sinuses or stops mucus from draining, including:  Allergies.  Asthma.  Bacterial or viral infection.  Abnormally shaped bones between the nasal passages.  Nasal growths that contain mucus (nasal polyps).  Narrow sinus openings.  Pollutants, such as chemicals or irritants in the air.  A foreign object stuck in the nose.  A fungal infection. This is rare. What increases the risk? The following factors may make you more likely to develop this condition:  Having allergies or asthma.  Having had a recent cold or respiratory tract infection.  Having structural deformities or blockages in your nose or sinuses.  Having a weak immune system.  Doing a lot of swimming or diving.  Overusing nasal sprays.  Smoking. What are the signs or symptoms? The main symptoms of this condition are pain and a feeling of pressure around the affected sinuses. Other symptoms include:  Upper toothache.  Earache.  Headache.  Bad breath.  Decreased sense of smell and taste.  A cough that may get worse at night.  Fatigue.  Fever.  Thick drainage from your nose. The drainage is often green and it may contain pus (purulent).  Stuffy nose or congestion.  Postnasal drip. This is when extra mucus collects in the throat or back of the nose.  Swelling and warmth over the affected sinuses.  Sore throat.  Sensitivity to light. How is this diagnosed? This condition is diagnosed based on symptoms, a medical history, and a physical exam. To find out if your condition is acute or chronic, your health care provider  may:  Look in your nose for signs of nasal polyps.  Tap over the affected sinus to check for signs of infection.  View the inside of your sinuses using an imaging device that has a light attached (endoscope). If your health care provider suspects that you have chronic sinusitis, you may also:  Be tested  for allergies.  Have a sample of mucus taken from your nose (nasal culture) and checked for bacteria.  Have a mucus sample examined to see if your sinusitis is related to an allergy. If your sinusitis does not respond to treatment and it lasts longer than 8 weeks, you may have an MRI or CT scan to check your sinuses. These scans also help to determine how severe your infection is. In rare cases, a bone biopsy may be done to rule out more serious types of fungal sinus disease. How is this treated? Treatment for sinusitis depends on the cause and whether your condition is chronic or acute. If a virus is causing your sinusitis, your symptoms will go away on their own within 10 days. You may be given medicines to relieve your symptoms, including:  Topical nasal decongestants. They shrink swollen nasal passages and let mucus drain from your sinuses.  Antihistamines. These drugs block inflammation that is triggered by allergies. This can help to ease swelling in your nose and sinuses.  Topical nasal corticosteroids. These are nasal sprays that ease inflammation and swelling in your nose and sinuses.  Nasal saline washes. These rinses can help to get rid of thick mucus in your nose. If your condition is caused by bacteria, you will be given an antibiotic medicine. If your condition is caused by a fungus, you will be given an antifungal medicine. Surgery may be needed to correct underlying conditions, such as narrow nasal passages. Surgery may also be needed to remove polyps. Follow these instructions at home: Medicines   Take, use, or apply over-the-counter and prescription medicines only as  told by your health care provider. These may include nasal sprays.  If you were prescribed an antibiotic medicine, take it as told by your health care provider. Do not stop taking the antibiotic even if you start to feel better. Hydrate and Humidify   Drink enough water to keep your urine clear or pale yellow. Staying hydrated will help to thin your mucus.  Use a cool mist humidifier to keep the humidity level in your home above 50%.  Inhale steam for 10-15 minutes, 3-4 times a day or as told by your health care provider. You can do this in the bathroom while a hot shower is running.  Limit your exposure to cool or dry air. Rest   Rest as much as possible.  Sleep with your head raised (elevated).  Make sure to get enough sleep each night. General instructions   Apply a warm, moist washcloth to your face 3-4 times a day or as told by your health care provider. This will help with discomfort.  Wash your hands often with soap and water to reduce your exposure to viruses and other germs. If soap and water are not available, use hand sanitizer.  Do not smoke. Avoid being around people who are smoking (secondhand smoke).  Keep all follow-up visits as told by your health care provider. This is important. Contact a health care provider if:  You have a fever.  Your symptoms get worse.  Your symptoms do not improve within 10 days. Get help right away if:  You have a severe headache.  You have persistent vomiting.  You have pain or swelling around your face or eyes.  You have vision problems.  You develop confusion.  Your neck is stiff.  You have trouble breathing. This information is not intended to replace advice given to you by your health care provider. Make sure you  discuss any questions you have with your health care provider. Document Released: 02/04/2005 Document Revised: 10/01/2015 Document Reviewed: 11/30/2014 Elsevier Interactive Patient Education  2017 [employer]  Otitis Media With Effusion, Pediatric Otitis media with effusion (OME) occurs when there is inflammation of the middle ear and fluid in the middle ear space. There are no signs and symptoms of infection. The middle ear space contains air and the bones for hearing. Air in the middle ear space helps to transmit sound to the brain. OME is a common condition in children, and it often occurs after an ear infection. This condition may be present for several weeks or longer after an ear infection. Most cases of this condition get better on their own. What are the causes? OME is caused by a blockage of the eustachian tube in one or both ears. These tubes drain fluid in the ears to the back of the nose (nasopharynx). If the tissue in the tube swells up (edema), the tube closes. This prevents fluid from draining. Blockage can be caused by:  Ear infections.  Colds and other upper respiratory infections.  Allergies.  Irritants, such as tobacco smoke.  Enlarged adenoids. The adenoids are areas of soft tissue located high in the back of the throat, behind the nose and the roof of the mouth. They are part of the body's natural defense (immune) system.  A mass in the nasopharynx.  Damage to the ear caused by pressure changes (barotrauma). What increases the risk? Your child is more likely to develop this condition if:  He or she has repeated ear and sinus infections.  He or she has allergies.  He or she is exposed to tobacco smoke.  He or she attends daycare.  He or she is not breastfed. What are the signs or symptoms? Symptoms of this condition may not be obvious. Sometimes this condition does not have any symptoms, or symptoms may overlap with those of a cold or upper respiratory tract illness. Symptoms of this condition include:  Temporary hearing loss.  A feeling of fullness in the ear without pain.  Irritability or agitation.  Balance (vestibular) problems. As a result of  hearing loss, your child may:  Listen to the TV at a loud volume.  Not respond to questions.  Ask "What?" often when spoken to.  Mistake or confuse one sound or word for another.  Perform poorly at school.  Have a poor attention span.  Become agitated or irritated easily. How is this diagnosed? This condition is diagnosed with an ear exam. Your child's health care provider will look inside your child's ear with an instrument (otoscope) to check for redness, swelling, and fluid. Other tests may be done, including:  A test to check the movement of the eardrum (pneumatic otoscopy). This is done by squeezing a small amount of air into the ear.  A test that changes air pressure in the middle ear to check how well the eardrum moves and to see if the eustachian tube is working (tympanogram).  Hearing test (audiogram). This test involves playing tones at different pitches to see if your child can hear each tone. How is this treated? Treatment for this condition depends on the cause. In many cases, the fluid goes away on its own. In some cases, your child may need a procedure to create a hole in the eardrum to allow fluid to drain (myringotomy) and to insert small drainage tubes (tympanostomy tubes) into the eardrums. These tubes help to drain fluid  and prevent infection. This procedure may be recommended if:  OME does not get better over several months.  Your child has many ear infections within several months.  Your child has noticeable hearing loss.  Your child has problems with speech and language development. Surgery may also be done to remove the adenoids (adenoidectomy). Follow these instructions at home:  Give over-the-counter and prescription medicines only as told by your child's health care provider.  Keep children away from any tobacco smoke.  Keep all follow-up visits as told by your child's health care provider. This is important. How is this prevented?  Keep your  child's vaccinations up to date. Make sure your child gets all recommended vaccinations, including a pneumonia and flu vaccine.  Encourage hand washing. Your child should wash his or her hands often with soap and water. If there is no soap and water, he or she should use hand sanitizer.  Avoid exposing your child to tobacco smoke.  Breastfeed your baby, if possible. Babies who are breastfed as long as possible are less likely to develop this condition. Contact a health care provider if:  Your child's hearing does not get better after 3 months.  Your child's hearing is worse.  Your child has ear pain.  Your child has a fever.  Your child has drainage from the ear.  Your child is dizzy.  Your child has a lump on his or her neck. Get help right away if:  Your child has bleeding from the nose.  Your child cannot move part of her or his face.  Your child has trouble breathing.  Your child cannot smell.  Your child develops severe congestion.  Your child develops weakness.  Your child who is younger than 3 months has a temperature of 100F (38C) or higher. Summary  Otitis media with effusion (OME) occurs when there is inflammation of the middle ear and fluid in the middle ear space.  This condition is caused by blockage of one or both eustachian tubes, which drain fluid in the ears to the back of the nose.  Symptoms of this condition can include temporary hearing loss, a feeling of fullness in the ear, irritability or agitation, and balance (vestibular) problems. Sometimes, there are no symptoms.  This condition is diagnosed with an ear exam and tests, such as pneumatic otoscopy, tympanogram, and audiogram.  Treatment for this condition depends on the cause. In many cases, the fluid goes away on its own. This information is not intended to replace advice given to you by your health care provider. Make sure you discuss any questions you have with your health care  provider. Document Released: 04/27/2003 Document Revised: 12/28/2015 Document Reviewed: 12/28/2015 Elsevier Interactive Patient Education  2017 [employer].

## 2016-05-16 ENCOUNTER — Ambulatory Visit: Payer: Self-pay | Admitting: *Deleted

## 2016-05-16 VITALS — BP 164/94

## 2016-05-16 DIAGNOSIS — I10 Essential (primary) hypertension: Secondary | ICD-10-CM

## 2016-05-16 NOTE — Progress Notes (Signed)
F/u BP check. Remains elevated. Asymptomatic. Encouraged to call PCP to schedule appt r/t HTN. She is agreeable to this.

## 2017-05-08 ENCOUNTER — Ambulatory Visit: Payer: Self-pay | Admitting: Registered Nurse

## 2017-05-08 VITALS — BP 118/94 | HR 63 | Temp 97.7°F | Resp 18

## 2017-05-08 DIAGNOSIS — R030 Elevated blood-pressure reading, without diagnosis of hypertension: Secondary | ICD-10-CM

## 2017-05-08 DIAGNOSIS — J0111 Acute recurrent frontal sinusitis: Secondary | ICD-10-CM

## 2017-05-08 DIAGNOSIS — J301 Allergic rhinitis due to pollen: Secondary | ICD-10-CM

## 2017-05-08 DIAGNOSIS — H8113 Benign paroxysmal vertigo, bilateral: Secondary | ICD-10-CM

## 2017-05-08 DIAGNOSIS — H6593 Unspecified nonsuppurative otitis media, bilateral: Secondary | ICD-10-CM

## 2017-05-08 MED ORDER — AZITHROMYCIN 250 MG PO TABS: ORAL_TABLET | ORAL | 0 refills | Status: AC

## 2017-05-08 MED ORDER — FLUTICASONE PROPIONATE 50 MCG/ACT NA SUSP: 1.0000 | Freq: Two times a day (BID) | NASAL | 6 refills | Status: DC

## 2017-05-08 MED ORDER — SALINE SPRAY 0.65 % NA SOLN: 2.0000 | NASAL | 0 refills | Status: AC

## 2017-05-08 NOTE — Progress Notes (Signed)
Subjective:    Patient ID: Amy Lewis, female    DOB: 02-11-1966, 52 y.o.   MRN: [id number]  51y/o caucasian female established patient here for blood pressure check as having intermittent mild vertigo while at work.  My grandmother used to have vertigo episodes that were very severe. Some sinus pressure, post nasal drip, ear fullness and headache noted.  Has not started her zyrtec, flonase, nasal saline yet for this allergy season.  + sick coworkers viral URI exposure     Review of Systems  Constitutional: Negative for activity change, appetite change, chills, diaphoresis, fatigue, fever and unexpected weight change.  HENT: Positive for congestion, ear pain, postnasal drip and sinus pressure. Negative for dental problem, drooling, ear discharge, facial swelling, hearing loss, mouth sores, nosebleeds, rhinorrhea, sinus pain, sneezing, sore throat, tinnitus, trouble swallowing and voice change.   Eyes: Negative for photophobia, pain, discharge, redness, itching and visual disturbance.  Respiratory: Negative for cough, choking, chest tightness, shortness of breath, wheezing and stridor.   Cardiovascular: Negative for chest pain, palpitations and leg swelling.  Gastrointestinal: Negative for abdominal pain, constipation, diarrhea, nausea and vomiting.  Endocrine: Negative for cold intolerance and heat intolerance.  Genitourinary: Negative for difficulty urinating, dysuria and hematuria.  Musculoskeletal: Negative for arthralgias, back pain, gait problem, joint swelling, myalgias, neck pain and neck stiffness.  Skin: Negative for color change, pallor, rash and wound.  Allergic/Immunologic: Positive for environmental allergies. Negative for food allergies.  Neurological: Positive for dizziness and headaches. Negative for tremors, seizures, syncope, facial asymmetry, speech difficulty, weakness, light-headedness and numbness.  Hematological: Negative for adenopathy. Does not bruise/bleed  easily.  Psychiatric/Behavioral: Negative for agitation, behavioral problems, confusion and sleep disturbance.       Objective:   Physical Exam  Constitutional: She is oriented to person, place, and time. She appears well-developed and well-nourished. She is active and cooperative.  Non-toxic appearance. She does not have a sickly appearance. She appears ill. No distress.  HENT:  Head: Normocephalic and atraumatic.  Right Ear: Hearing, external ear and ear canal normal. A middle ear effusion is present.  Left Ear: Hearing, external ear and ear canal normal. A middle ear effusion is present.  Nose: Mucosal edema and rhinorrhea present. No nose lacerations, sinus tenderness, nasal deformity, septal deviation or nasal septal hematoma. No epistaxis.  No foreign bodies. Right sinus exhibits frontal sinus tenderness. Right sinus exhibits no maxillary sinus tenderness. Left sinus exhibits frontal sinus tenderness. Left sinus exhibits no maxillary sinus tenderness.  Mouth/Throat: Uvula is midline and mucous membranes are normal. Mucous membranes are not pale, not dry and not cyanotic. She does not have dentures. No oral lesions. No trismus in the jaw. Normal dentition. No dental abscesses, uvula swelling, lacerations or dental caries. Posterior oropharyngeal edema and posterior oropharyngeal erythema present. No oropharyngeal exudate or tonsillar abscesses.  Cobblestoning posterior pharynx; bilateral TMs air fluid level slight opacity/haziness; bilateral allergic shiners; nasal turbinates bilateral edema/erythema clear discharge  Eyes: Pupils are equal, round, and reactive to light. Conjunctivae, EOM and lids are normal. Right eye exhibits no chemosis, no discharge, no exudate and no hordeolum. No foreign body present in the right eye. Left eye exhibits no chemosis, no discharge, no exudate and no hordeolum. No foreign body present in the left eye. Right conjunctiva is not injected. Right conjunctiva has no  hemorrhage. Left conjunctiva is not injected. Left conjunctiva has no hemorrhage. No scleral icterus. Right eye exhibits normal extraocular motion and no nystagmus. Left eye exhibits normal extraocular  motion and no nystagmus. Right pupil is round and reactive. Left pupil is round and reactive. Pupils are equal.  Neck: Trachea normal, normal range of motion and phonation normal. Neck supple. No tracheal tenderness and no muscular tenderness present. No neck rigidity. No tracheal deviation, no edema, no erythema and normal range of motion present. No thyroid mass and no thyromegaly present.  Cardiovascular: Normal rate, regular rhythm, S1 normal, S2 normal, normal heart sounds and intact distal pulses. PMI is not displaced. Exam reveals no gallop, no distant heart sounds and no friction rub.  No murmur heard. Pulmonary/Chest: Effort normal and breath sounds normal. No accessory muscle usage or stridor. No respiratory distress. She has no decreased breath sounds. She has no wheezes. She has no rhonchi. She has no rales. She exhibits no tenderness.  No cough observed in exam room; spoke full sentences without difficulty  Abdominal: Soft. Normal appearance. She exhibits no distension, no fluid wave and no ascites. There is no rigidity and no guarding.  Musculoskeletal: Normal range of motion. She exhibits no edema or tenderness.       Right shoulder: Normal.       Left shoulder: Normal.       Right elbow: Normal.      Left elbow: Normal.       Right hip: Normal.       Left hip: Normal.       Right knee: Normal.       Left knee: Normal.       Cervical back: Normal.       Thoracic back: Normal.       Lumbar back: Normal.       Right hand: Normal.       Left hand: Normal.  Lymphadenopathy:       Head (right side): No submental, no submandibular, no tonsillar, no preauricular, no posterior auricular and no occipital adenopathy present.       Head (left side): No submental, no submandibular, no  tonsillar, no preauricular, no posterior auricular and no occipital adenopathy present.    She has no cervical adenopathy.       Right cervical: No superficial cervical, no deep cervical and no posterior cervical adenopathy present.      Left cervical: No superficial cervical, no deep cervical and no posterior cervical adenopathy present.  Neurological: She is alert and oriented to person, place, and time. She has normal strength. She is not disoriented. She displays no atrophy and no tremor. No cranial nerve deficit or sensory deficit. She exhibits normal muscle tone. She displays no seizure activity. Coordination and gait normal. GCS eye subscore is 4. GCS verbal subscore is 5. GCS motor subscore is 6.  Bilateral hand grasp equal 5/5; on/off exam table; in/out of chair without difficulty; gait sure and steady in exam room  Skin: Skin is warm, dry and intact. No abrasion, no bruising, no burn, no ecchymosis, no laceration, no lesion, no petechiae and no rash noted. She is not diaphoretic. No cyanosis or erythema. No pallor. Nails show no clubbing.  Psychiatric: She has a normal mood and affect. Her speech is normal and behavior is normal. Judgment and thought content normal. Cognition and memory are normal.  Nursing note and vitals reviewed.         Assessment & Plan:  A-recurrent acute frontal sinusitis, seasonal allergic rhinitis, BPPV, elevated blood pressure  P-Patient may use normal saline nasal spray 2 sprays each nostril q2h wa as needed given 1 bottle from  clinic stock. Restart flonase 50mcg 1 spray each nostril BID #1 RF6 electronic Rx.  Patient denied personal or family history of ENT cancer.  OTC antihistamine of choice zyrtec 10mg  po daily.  Avoid triggers if possible.  Shower prior to bedtime if exposed to triggers.  If allergic dust/dust mites recommend mattress/pillow covers/encasements; washing linens, vacuuming, sweeping, dusting weekly.  Call or return to clinic as needed if  these symptoms worsen or fail to improve as anticipated.   Exitcare handout on allergic rhinitis and sinus rinse given to patient.  Patient verbalized understanding of instructions, agreed with plan of care and had no further questions at this time.  P2:  Avoidance and hand washing.  Restart flonase 1 spray each nostril BID, saline 2 sprays each nostril q2h wa prn congestion.  If no improvement with 48 hours of saline and flonase use start azithromycin 500mg  po day 1 then 250mg  po days2-5 #6 RF dispensed from [facility].  Patient allergic to penicillin.    Denied personal or family history of ENT cancer.  Shower BID especially prior to bed. No evidence of systemic bacterial infection, non toxic and well hydrated.  I do not see where any further testing or imaging is necessary at this time.   I will suggest supportive care, rest, good hygiene and encourage the patient to take adequate fluids.  The patient is to return to clinic or EMERGENCY ROOM if symptoms worsen or change significantly.  Exitcare handout on sinusitis and sinus rinse given to patient.  Patient verbalized agreement and understanding of treatment plan and had no further questions at this time.   P2:  Hand washing and cover cough   Supportive treatment.   No evidence of invasive bacterial infection, non toxic and well hydrated.  This is most likely self limiting viral infection.  I do not see where any further testing or imaging is necessary at this time.   I will suggest supportive care, rest, good hygiene and encourage the patient to take adequate fluids.  The patient is to return to clinic or EMERGENCY ROOM if symptoms worsen or change significantly e.g. ear pain, fever, purulent discharge from ears or bleeding.  Exitcare handout on otitis media with effusion given to patient.  Patient verbalized agreement and understanding of treatment plan.    Discussed with patient otitis media with effusion probably causing vertigo but could also be age.  Electronic Rx meclizine 25 mg po QID prn vertigo #30 RF1.  Possible side effects drowsiness or insomnia. Supportive treatment may take up to 4 doses meclizine per day max 100mg  per 24 hours.  Discussed with patient she can also find OTC near dramamine check active ingredient label usually 12.5-50mg  per dramamine tablet may take 2 or cut in half if needed. Discussed signs/symptoms stroke. recommended not driving during vertigo episodes.  Follow up if aphasia, dysphasia, visual changes, weakness, fall, worst headache of life, incoordination, fever, ear discharge.  Consider ENT evaluation/follow up with PCM if worsening symptoms not controlled with meclizine Exitcare handout on BPPV.  Patient verbalized understanding of information/agreed with plan of care and had no further questions at this time.  Follow up with RN Rolly SalterHaley for repeat blood pressure after feeling better sooner if feeling worse.  ER if chest pain, visual changes, worst headache of her life, dyspnea.

## 2017-05-09 ENCOUNTER — Encounter: Payer: Self-pay | Admitting: Registered Nurse

## 2017-05-09 DIAGNOSIS — N816 Rectocele: Secondary | ICD-10-CM | POA: Insufficient documentation

## 2017-05-09 DIAGNOSIS — J301 Allergic rhinitis due to pollen: Secondary | ICD-10-CM | POA: Insufficient documentation

## 2017-05-09 MED ORDER — MECLIZINE HCL 25 MG PO TABS: 25.0000 mg | ORAL_TABLET | Freq: Four times a day (QID) | ORAL | 1 refills | Status: AC | PRN

## 2017-05-09 MED ORDER — CETIRIZINE HCL 10 MG PO TABS: 10.0000 mg | ORAL_TABLET | Freq: Every day | ORAL | 11 refills | Status: AC

## 2017-05-09 NOTE — Patient Instructions (Signed)
Allergic Rhinitis, Adult Allergic rhinitis is an allergic reaction that affects the mucous membrane inside the nose. It causes sneezing, a runny or stuffy nose, and the feeling of mucus going down the back of the throat (postnasal drip). Allergic rhinitis can be mild to severe. There are two types of allergic rhinitis:  Seasonal. This type is also called hay fever. It happens only during certain seasons.  Perennial. This type can happen at any time of the year.  What are the causes? This condition happens when the body's defense system (immune system) responds to certain harmless substances called allergens as though they were germs.  Seasonal allergic rhinitis is triggered by pollen, which can come from grasses, trees, and weeds. Perennial allergic rhinitis may be caused by:  House dust mites.  Pet dander.  Mold spores.  What are the signs or symptoms? Symptoms of this condition include:  Sneezing.  Runny or stuffy nose (nasal congestion).  Postnasal drip.  Itchy nose.  Tearing of the eyes.  Trouble sleeping.  Daytime sleepiness.  How is this diagnosed? This condition may be diagnosed based on:  Your medical history.  A physical exam.  Tests to check for related conditions, such as: ? Asthma. ? Pink eye. ? Ear infection. ? Upper respiratory infection.  Tests to find out which allergens trigger your symptoms. These may include skin or blood tests.  How is this treated? There is no cure for this condition, but treatment can help control symptoms. Treatment may include:  Taking medicines that block allergy symptoms, such as antihistamines. Medicine may be given as a shot, nasal spray, or pill.  Avoiding the allergen.  Desensitization. This treatment involves getting ongoing shots until your body becomes less sensitive to the allergen. This treatment may be done if other treatments do not help.  If taking medicine and avoiding the allergen does not work, new,  stronger medicines may be prescribed.  Follow these instructions at home:  Find out what you are allergic to. Common allergens include smoke, dust, and pollen.  Avoid the things you are allergic to. These are some things you can do to help avoid allergens: ? Replace carpet with wood, tile, or vinyl flooring. Carpet can trap dander and dust. ? Do not smoke. Do not allow smoking in your home. ? Change your heating and air conditioning filter at least once a month. ? During allergy season:  Keep windows closed as much as possible.  Plan outdoor activities when pollen counts are lowest. This is usually during the evening hours.  When coming indoors, change clothing and shower before sitting on furniture or bedding.  Take over-the-counter and prescription medicines only as told by your health care provider.  Keep all follow-up visits as told by your health care provider. This is important. Contact a health care provider if:  You have a fever.  You develop a persistent cough.  You make whistling sounds when you breathe (you wheeze).  Your symptoms interfere with your normal daily activities. Get help right away if:  You have shortness of breath. Summary  This condition can be managed by taking medicines as directed and avoiding allergens.  Contact your health care provider if you develop a persistent cough or fever.  During allergy season, keep windows closed as much as possible. This information is not intended to replace advice given to you by your health care provider. Make sure you discuss any questions you have with your health care provider. Document Released: 10/30/2000 Document Revised: 03/14/2016  Document Reviewed: 03/14/2016 Elsevier Interactive Patient Education  2018 [employer]. Sinusitis, Adult Sinusitis is soreness and inflammation of your sinuses. Sinuses are hollow spaces in the bones around your face. Your sinuses are located:  Around your eyes.  In the  middle of your forehead.  Behind your nose.  In your cheekbones.  Your sinuses and nasal passages are lined with a stringy fluid (mucus). Mucus normally drains out of your sinuses. When your nasal tissues become inflamed or swollen, the mucus can become trapped or blocked so air cannot flow through your sinuses. This allows bacteria, viruses, and funguses to grow, which leads to infection. Sinusitis can develop quickly and last for 7?10 days (acute) or for more than 12 weeks (chronic). Sinusitis often develops after a cold. What are the causes? This condition is caused by anything that creates swelling in the sinuses or stops mucus from draining, including:  Allergies.  Asthma.  Bacterial or viral infection.  Abnormally shaped bones between the nasal passages.  Nasal growths that contain mucus (nasal polyps).  Narrow sinus openings.  Pollutants, such as chemicals or irritants in the air.  A foreign object stuck in the nose.  A fungal infection. This is rare.  What increases the risk? The following factors may make you more likely to develop this condition:  Having allergies or asthma.  Having had a recent cold or respiratory tract infection.  Having structural deformities or blockages in your nose or sinuses.  Having a weak immune system.  Doing a lot of swimming or diving.  Overusing nasal sprays.  Smoking.  What are the signs or symptoms? The main symptoms of this condition are pain and a feeling of pressure around the affected sinuses. Other symptoms include:  Upper toothache.  Earache.  Headache.  Bad breath.  Decreased sense of smell and taste.  A cough that may get worse at night.  Fatigue.  Fever.  Thick drainage from your nose. The drainage is often green and it may contain pus (purulent).  Stuffy nose or congestion.  Postnasal drip. This is when extra mucus collects in the throat or back of the nose.  Swelling and warmth over the  affected sinuses.  Sore throat.  Sensitivity to light.  How is this diagnosed? This condition is diagnosed based on symptoms, a medical history, and a physical exam. To find out if your condition is acute or chronic, your health care provider may:  Look in your nose for signs of nasal polyps.  Tap over the affected sinus to check for signs of infection.  View the inside of your sinuses using an imaging device that has a light attached (endoscope).  If your health care provider suspects that you have chronic sinusitis, you may also:  Be tested for allergies.  Have a sample of mucus taken from your nose (nasal culture) and checked for bacteria.  Have a mucus sample examined to see if your sinusitis is related to an allergy.  If your sinusitis does not respond to treatment and it lasts longer than 8 weeks, you may have an MRI or CT scan to check your sinuses. These scans also help to determine how severe your infection is. In rare cases, a bone biopsy may be done to rule out more serious types of fungal sinus disease. How is this treated? Treatment for sinusitis depends on the cause and whether your condition is chronic or acute. If a virus is causing your sinusitis, your symptoms will go away on their own  within 10 days. You may be given medicines to relieve your symptoms, including:  Topical nasal decongestants. They shrink swollen nasal passages and let mucus drain from your sinuses.  Antihistamines. These drugs block inflammation that is triggered by allergies. This can help to ease swelling in your nose and sinuses.  Topical nasal corticosteroids. These are nasal sprays that ease inflammation and swelling in your nose and sinuses.  Nasal saline washes. These rinses can help to get rid of thick mucus in your nose.  If your condition is caused by bacteria, you will be given an antibiotic medicine. If your condition is caused by a fungus, you will be given an antifungal  medicine. Surgery may be needed to correct underlying conditions, such as narrow nasal passages. Surgery may also be needed to remove polyps. Follow these instructions at home: Medicines  Take, use, or apply over-the-counter and prescription medicines only as told by your health care provider. These may include nasal sprays.  If you were prescribed an antibiotic medicine, take it as told by your health care provider. Do not stop taking the antibiotic even if you start to feel better. Hydrate and Humidify  Drink enough water to keep your urine clear or pale yellow. Staying hydrated will help to thin your mucus.  Use a cool mist humidifier to keep the humidity level in your home above 50%.  Inhale steam for 10-15 minutes, 3-4 times a day or as told by your health care provider. You can do this in the bathroom while a hot shower is running.  Limit your exposure to cool or dry air. Rest  Rest as much as possible.  Sleep with your head raised (elevated).  Make sure to get enough sleep each night. General instructions  Apply a warm, moist washcloth to your face 3-4 times a day or as told by your health care provider. This will help with discomfort.  Wash your hands often with soap and water to reduce your exposure to viruses and other germs. If soap and water are not available, use hand sanitizer.  Do not smoke. Avoid being around people who are smoking (secondhand smoke).  Keep all follow-up visits as told by your health care provider. This is important. Contact a health care provider if:  You have a fever.  Your symptoms get worse.  Your symptoms do not improve within 10 days. Get help right away if:  You have a severe headache.  You have persistent vomiting.  You have pain or swelling around your face or eyes.  You have vision problems.  You develop confusion.  Your neck is stiff.  You have trouble breathing. This information is not intended to replace advice given  to you by your health care provider. Make sure you discuss any questions you have with your health care provider. Document Released: 02/04/2005 Document Revised: 10/01/2015 Document Reviewed: 11/30/2014 Elsevier Interactive Patient Education  2018 [employer] Sinus Rinse What is a sinus rinse? A sinus rinse is a simple home treatment that is used to rinse your sinuses with a sterile mixture of salt and water (saline solution). Sinuses are air-filled spaces in your skull behind the bones of your face and forehead that open into your nasal cavity. You will use the following:  Saline solution.  Neti pot or spray bottle. This releases the saline solution into your nose and through your sinuses. Neti pots and spray bottles can be purchased at your local pharmacy, a health food store, or online.  When would   I do a sinus rinse? A sinus rinse can help to clear mucus, dirt, dust, or pollen from the nasal cavity. You may do a sinus rinse when you have a cold, a virus, nasal allergy symptoms, a sinus infection, or stuffiness in the nose or sinuses. If you are considering a sinus rinse:  Ask your child's health care provider before performing a sinus rinse on your child.  Do not do a sinus rinse if you have had ear or nasal surgery, ear infection, or blocked ears.  How do I do a sinus rinse?  Wash your hands.  Disinfect your device according to the directions provided and then dry it.  Use the solution that comes with your device or one that is sold separately in stores. Follow the mixing directions on the package.  Fill your device with the amount of saline solution as directed by the device instructions.  Stand over a sink and tilt your head sideways over the sink.  Place the spout of the device in your upper nostril (the one closer to the ceiling).  Gently pour or squeeze the saline solution into the nasal cavity. The liquid should drain to the lower nostril if you are not overly  congested.  Gently blow your nose. Blowing too hard may cause ear pain.  Repeat in the other nostril.  Clean and rinse your device with clean water and then air-dry it. Are there risks of a sinus rinse? Sinus rinse is generally very safe and effective. However, there are a few risks, which include:  A burning sensation in the sinuses. This may happen if you do not make the saline solution as directed. Make sure to follow all directions when making the saline solution.  Infection from contaminated water. This is rare, but possible.  Nasal irritation.  This information is not intended to replace advice given to you by your health care provider. Make sure you discuss any questions you have with your health care provider. Document Released: 09/01/2013 Document Revised: 01/02/2016 Document Reviewed: 06/22/2013 Elsevier Interactive Patient Education  2017 [employer] Otitis Media With Effusion, Pediatric Otitis media with effusion (OME) occurs when there is inflammation of the middle ear and fluid in the middle ear space. There are no signs and symptoms of infection. The middle ear space contains air and the bones for hearing. Air in the middle ear space helps to transmit sound to the brain. OME is a common condition in children, and it often occurs after an ear infection. This condition may be present for several weeks or longer after an ear infection. Most cases of this condition get better on their own. What are the causes? OME is caused by a blockage of the eustachian tube in one or both ears. These tubes drain fluid in the ears to the back of the nose (nasopharynx). If the tissue in the tube swells up (edema), the tube closes. This prevents fluid from draining. Blockage can be caused by:  Ear infections.  Colds and other upper respiratory infections.  Allergies.  Irritants, such as tobacco smoke.  Enlarged adenoids. The adenoids are areas of soft tissue located high in the back of  the throat, behind the nose and the roof of the mouth. They are part of the body's natural defense (immune) system.  A mass in the nasopharynx.  Damage to the ear caused by pressure changes (barotrauma).  What increases the risk? Your child is more likely to develop this condition if:  He or she has  repeated ear and sinus infections.  He or she has allergies.  He or she is exposed to tobacco smoke.  He or she attends daycare.  He or she is not breastfed.  What are the signs or symptoms? Symptoms of this condition may not be obvious. Sometimes this condition does not have any symptoms, or symptoms may overlap with those of a cold or upper respiratory tract illness. Symptoms of this condition include:  Temporary hearing loss.  A feeling of fullness in the ear without pain.  Irritability or agitation.  Balance (vestibular) problems.  As a result of hearing loss, your child may:  Listen to the TV at a loud volume.  Not respond to questions.  Ask "What?" often when spoken to.  Mistake or confuse one sound or word for another.  Perform poorly at school.  Have a poor attention span.  Become agitated or irritated easily.  How is this diagnosed? This condition is diagnosed with an ear exam. Your child's health care provider will look inside your child's ear with an instrument (otoscope) to check for redness, swelling, and fluid. Other tests may be done, including:  A test to check the movement of the eardrum (pneumatic otoscopy). This is done by squeezing a small amount of air into the ear.  A test that changes air pressure in the middle ear to check how well the eardrum moves and to see if the eustachian tube is working (tympanogram).  Hearing test (audiogram). This test involves playing tones at different pitches to see if your child can hear each tone.  How is this treated? Treatment for this condition depends on the cause. In many cases, the fluid goes away on its  own. In some cases, your child may need a procedure to create a hole in the eardrum to allow fluid to drain (myringotomy) and to insert small drainage tubes (tympanostomy tubes) into the eardrums. These tubes help to drain fluid and prevent infection. This procedure may be recommended if:  OME does not get better over several months.  Your child has many ear infections within several months.  Your child has noticeable hearing loss.  Your child has problems with speech and language development.  Surgery may also be done to remove the adenoids (adenoidectomy). Follow these instructions at home:  Give over-the-counter and prescription medicines only as told by your child's health care provider.  Keep children away from any tobacco smoke.  Keep all follow-up visits as told by your child's health care provider. This is important. How is this prevented?  Keep your child's vaccinations up to date. Make sure your child gets all recommended vaccinations, including a pneumonia and flu vaccine.  Encourage hand washing. Your child should wash his or her hands often with soap and water. If there is no soap and water, he or she should use hand sanitizer.  Avoid exposing your child to tobacco smoke.  Breastfeed your baby, if possible. Babies who are breastfed as long as possible are less likely to develop this condition. Contact a health care provider if:  Your child's hearing does not get better after 3 months.  Your child's hearing is worse.  Your child has ear pain.  Your child has a fever.  Your child has drainage from the ear.  Your child is dizzy.  Your child has a lump on his or her neck. Get help right away if:  Your child has bleeding from the nose.  Your child cannot move part of her or  his face.  Your child has trouble breathing.  Your child cannot smell.  Your child develops severe congestion.  Your child develops weakness.  Your child who is younger than 3  months has a temperature of 100F (38C) or higher. Summary  Otitis media with effusion (OME) occurs when there is inflammation of the middle ear and fluid in the middle ear space.  This condition is caused by blockage of one or both eustachian tubes, which drain fluid in the ears to the back of the nose.  Symptoms of this condition can include temporary hearing loss, a feeling of fullness in the ear, irritability or agitation, and balance (vestibular) problems. Sometimes, there are no symptoms.  This condition is diagnosed with an ear exam and tests, such as pneumatic otoscopy, tympanogram, and audiogram.  Treatment for this condition depends on the cause. In many cases, the fluid goes away on its own. This information is not intended to replace advice given to you by your health care provider. Make sure you discuss any questions you have with your health care provider. Document Released: 04/27/2003 Document Revised: 12/28/2015 Document Reviewed: 12/28/2015 Elsevier Interactive Patient Education  2017 [employer]

## 2017-05-19 DIAGNOSIS — J45909 Unspecified asthma, uncomplicated: Secondary | ICD-10-CM | POA: Diagnosis not present

## 2017-05-19 DIAGNOSIS — J069 Acute upper respiratory infection, unspecified: Secondary | ICD-10-CM | POA: Diagnosis not present

## 2017-05-23 DIAGNOSIS — J209 Acute bronchitis, unspecified: Secondary | ICD-10-CM | POA: Diagnosis not present

## 2018-01-13 DIAGNOSIS — K219 Gastro-esophageal reflux disease without esophagitis: Secondary | ICD-10-CM | POA: Diagnosis not present

## 2018-01-13 DIAGNOSIS — I10 Essential (primary) hypertension: Secondary | ICD-10-CM | POA: Diagnosis not present

## 2018-03-20 ENCOUNTER — Telehealth: Payer: Self-pay | Admitting: Registered Nurse

## 2018-03-20 ENCOUNTER — Encounter: Payer: Self-pay | Admitting: Registered Nurse

## 2018-03-20 MED ORDER — FLUTICASONE PROPIONATE 50 MCG/ACT NA SUSP: 1.0000 | Freq: Two times a day (BID) | NASAL | 6 refills | Status: AC

## 2018-03-20 NOTE — Telephone Encounter (Signed)
Pt emailed to notify. Rx added to clipboard.

## 2018-03-20 NOTE — Telephone Encounter (Signed)
Received refill request from pharmacy for patient flonase.  Electronic Rx entered please notify patient.

## 2018-03-20 NOTE — Telephone Encounter (Signed)
noted 

## 2019-02-04 DIAGNOSIS — K219 Gastro-esophageal reflux disease without esophagitis: Secondary | ICD-10-CM | POA: Diagnosis not present

## 2019-02-04 DIAGNOSIS — Z9109 Other allergy status, other than to drugs and biological substances: Secondary | ICD-10-CM | POA: Diagnosis not present

## 2019-02-04 DIAGNOSIS — I10 Essential (primary) hypertension: Secondary | ICD-10-CM | POA: Diagnosis not present

## 2019-02-16 DIAGNOSIS — I10 Essential (primary) hypertension: Secondary | ICD-10-CM | POA: Diagnosis not present

## 2020-03-14 DIAGNOSIS — K219 Gastro-esophageal reflux disease without esophagitis: Secondary | ICD-10-CM | POA: Diagnosis not present

## 2020-03-14 DIAGNOSIS — I10 Essential (primary) hypertension: Secondary | ICD-10-CM | POA: Diagnosis not present

## 2021-01-01 ENCOUNTER — Encounter: Payer: Self-pay | Admitting: *Deleted

## 2021-01-01 ENCOUNTER — Telehealth: Payer: Self-pay | Admitting: *Deleted

## 2021-01-01 DIAGNOSIS — Z20822 Contact with and (suspected) exposure to covid-19: Secondary | ICD-10-CM

## 2021-01-01 NOTE — Telephone Encounter (Signed)
Pt with exposure to positive coworker.  Spoke with pt by phone. Updated contact info.  Pt denies sx of Covid. Exposure 12/29/20. May RTW onsite 11/15 with mask wear through Day 10 11/21. Test Day 5 11/16. If sx develop, notify clinic for new testing schedule.  Pt agreeable. Denies questions or concerns.

## 2021-01-01 NOTE — Telephone Encounter (Signed)
Notified by HR patient exposed to coworker who tested positive for covid on 12/30/20.  RN Rolly Salter instructed to contact patient to discuss exposure protocols.  Agreed with plan of care for testing Day 5 and mask wear when at work x 10 days, no eating in employee lunch room and monitoring for coving symptoms through day 10 post exposure. Reviewed RN Rolly Salter note.   Patient asymptomatic.

## 2021-01-04 NOTE — Telephone Encounter (Signed)
Reviewed RN Rolly Salter note agreed with plan of care.  I spoke with patient yesterday via telephone and notified HR covid test negative and continues asymptomatic, monitoring for symptoms. late entry.

## 2021-01-04 NOTE — Telephone Encounter (Signed)
Spoke with pt by phone. She reports home covid test yesterday 11/16 Day 5 was negative. She remains asymptomatic. She is working remote this week but will be onsite Monday 11/21. Strict mask use thru 11/21. Pt agreeable, denies questions or concerns.

## 2021-01-08 NOTE — Telephone Encounter (Signed)
Pt returned call. Confirms asymptomatic. Closing encounter.

## 2021-01-08 NOTE — Telephone Encounter (Signed)
Called pt to confirm still asymptomatic on Day 10 today. No answer. LVM. Did have training class today. Will f/u again tomorrow.

## 2021-01-08 NOTE — Telephone Encounter (Signed)
Reviewed RN Rolly Salter note patient asymptomatic and day 10 post exposure with 1 negative covid test closing encounter.

## 2021-08-29 DIAGNOSIS — Z9109 Other allergy status, other than to drugs and biological substances: Secondary | ICD-10-CM | POA: Diagnosis not present

## 2021-08-29 DIAGNOSIS — M545 Low back pain, unspecified: Secondary | ICD-10-CM | POA: Diagnosis not present

## 2021-08-29 DIAGNOSIS — I10 Essential (primary) hypertension: Secondary | ICD-10-CM | POA: Diagnosis not present

## 2021-08-29 DIAGNOSIS — Z79899 Other long term (current) drug therapy: Secondary | ICD-10-CM | POA: Diagnosis not present

## 2021-08-29 DIAGNOSIS — I83893 Varicose veins of bilateral lower extremities with other complications: Secondary | ICD-10-CM | POA: Diagnosis not present

## 2021-09-03 DIAGNOSIS — M25512 Pain in left shoulder: Secondary | ICD-10-CM | POA: Diagnosis not present

## 2021-09-03 DIAGNOSIS — M545 Low back pain, unspecified: Secondary | ICD-10-CM | POA: Diagnosis not present

## 2021-09-07 DIAGNOSIS — M25512 Pain in left shoulder: Secondary | ICD-10-CM | POA: Diagnosis not present

## 2021-09-07 DIAGNOSIS — M545 Low back pain, unspecified: Secondary | ICD-10-CM | POA: Diagnosis not present

## 2021-09-10 DIAGNOSIS — M25512 Pain in left shoulder: Secondary | ICD-10-CM | POA: Diagnosis not present

## 2021-09-10 DIAGNOSIS — M545 Low back pain, unspecified: Secondary | ICD-10-CM | POA: Diagnosis not present

## 2021-09-12 DIAGNOSIS — M25512 Pain in left shoulder: Secondary | ICD-10-CM | POA: Diagnosis not present

## 2021-09-12 DIAGNOSIS — M545 Low back pain, unspecified: Secondary | ICD-10-CM | POA: Diagnosis not present

## 2021-09-17 DIAGNOSIS — M25512 Pain in left shoulder: Secondary | ICD-10-CM | POA: Diagnosis not present

## 2021-09-17 DIAGNOSIS — M545 Low back pain, unspecified: Secondary | ICD-10-CM | POA: Diagnosis not present

## 2021-09-19 DIAGNOSIS — M545 Low back pain, unspecified: Secondary | ICD-10-CM | POA: Diagnosis not present

## 2021-09-19 DIAGNOSIS — M25512 Pain in left shoulder: Secondary | ICD-10-CM | POA: Diagnosis not present

## 2021-09-24 DIAGNOSIS — M545 Low back pain, unspecified: Secondary | ICD-10-CM | POA: Diagnosis not present

## 2021-09-24 DIAGNOSIS — M25512 Pain in left shoulder: Secondary | ICD-10-CM | POA: Diagnosis not present

## 2021-09-26 DIAGNOSIS — M545 Low back pain, unspecified: Secondary | ICD-10-CM | POA: Diagnosis not present

## 2021-09-26 DIAGNOSIS — M25512 Pain in left shoulder: Secondary | ICD-10-CM | POA: Diagnosis not present

## 2021-10-01 DIAGNOSIS — M545 Low back pain, unspecified: Secondary | ICD-10-CM | POA: Diagnosis not present

## 2021-10-01 DIAGNOSIS — M25512 Pain in left shoulder: Secondary | ICD-10-CM | POA: Diagnosis not present

## 2021-10-03 DIAGNOSIS — M545 Low back pain, unspecified: Secondary | ICD-10-CM | POA: Diagnosis not present

## 2021-10-03 DIAGNOSIS — M25512 Pain in left shoulder: Secondary | ICD-10-CM | POA: Diagnosis not present

## 2022-03-14 DIAGNOSIS — I10 Essential (primary) hypertension: Secondary | ICD-10-CM | POA: Diagnosis not present

## 2022-03-14 DIAGNOSIS — Z0000 Encounter for general adult medical examination without abnormal findings: Secondary | ICD-10-CM | POA: Diagnosis not present

## 2022-03-14 DIAGNOSIS — Z23 Encounter for immunization: Secondary | ICD-10-CM | POA: Diagnosis not present

## 2022-03-14 DIAGNOSIS — E782 Mixed hyperlipidemia: Secondary | ICD-10-CM | POA: Diagnosis not present

## 2022-07-12 DIAGNOSIS — Z1211 Encounter for screening for malignant neoplasm of colon: Secondary | ICD-10-CM | POA: Diagnosis not present

## 2022-07-12 DIAGNOSIS — K644 Residual hemorrhoidal skin tags: Secondary | ICD-10-CM | POA: Diagnosis not present

## 2022-07-12 DIAGNOSIS — K648 Other hemorrhoids: Secondary | ICD-10-CM | POA: Diagnosis not present

## 2023-03-20 ENCOUNTER — Other Ambulatory Visit: Payer: Self-pay | Admitting: Family Medicine

## 2023-03-20 DIAGNOSIS — Z0000 Encounter for general adult medical examination without abnormal findings: Secondary | ICD-10-CM | POA: Diagnosis not present

## 2023-03-20 DIAGNOSIS — E782 Mixed hyperlipidemia: Secondary | ICD-10-CM | POA: Diagnosis not present

## 2023-03-20 DIAGNOSIS — Z9109 Other allergy status, other than to drugs and biological substances: Secondary | ICD-10-CM | POA: Diagnosis not present

## 2023-03-20 DIAGNOSIS — Z780 Asymptomatic menopausal state: Secondary | ICD-10-CM

## 2023-03-20 DIAGNOSIS — K219 Gastro-esophageal reflux disease without esophagitis: Secondary | ICD-10-CM | POA: Diagnosis not present

## 2023-03-20 DIAGNOSIS — I10 Essential (primary) hypertension: Secondary | ICD-10-CM | POA: Diagnosis not present

## 2023-03-20 DIAGNOSIS — Z1231 Encounter for screening mammogram for malignant neoplasm of breast: Secondary | ICD-10-CM

## 2023-04-09 ENCOUNTER — Ambulatory Visit: Payer: Self-pay

## 2023-04-23 ENCOUNTER — Ambulatory Visit: Payer: Self-pay

## 2023-05-01 ENCOUNTER — Ambulatory Visit
Admission: RE | Admit: 2023-05-01 | Discharge: 2023-05-01 | Disposition: A | Discharge status: Home / Self Care | Payer: Self-pay | Source: Ambulatory Visit | Attending: Family Medicine | Admitting: Family Medicine

## 2023-05-01 DIAGNOSIS — Z1231 Encounter for screening mammogram for malignant neoplasm of breast: Secondary | ICD-10-CM

## 2023-07-16 DIAGNOSIS — M79661 Pain in right lower leg: Secondary | ICD-10-CM | POA: Diagnosis not present

## 2023-07-16 DIAGNOSIS — M7989 Other specified soft tissue disorders: Secondary | ICD-10-CM | POA: Diagnosis not present

## 2023-07-16 DIAGNOSIS — I87393 Chronic venous hypertension (idiopathic) with other complications of bilateral lower extremity: Secondary | ICD-10-CM | POA: Diagnosis not present

## 2023-07-16 DIAGNOSIS — I83891 Varicose veins of right lower extremities with other complications: Secondary | ICD-10-CM | POA: Diagnosis not present

## 2023-07-16 DIAGNOSIS — M79604 Pain in right leg: Secondary | ICD-10-CM | POA: Diagnosis not present

## 2023-07-16 DIAGNOSIS — M79662 Pain in left lower leg: Secondary | ICD-10-CM | POA: Diagnosis not present

## 2023-11-12 ENCOUNTER — Other Ambulatory Visit: Payer: Self-pay
